# Patient Record
Sex: Female | Born: 1965 | Race: White | Hispanic: No | Marital: Married | Smoking: Never smoker
Health system: Southern US, Community
[De-identification: ages and names within clinical notes are randomized; demographics above are authoritative.]

## PROBLEM LIST (undated history)

## (undated) DIAGNOSIS — D649 Anemia, unspecified: Secondary | ICD-10-CM

## (undated) DIAGNOSIS — M549 Dorsalgia, unspecified: Secondary | ICD-10-CM

## (undated) DIAGNOSIS — K219 Gastro-esophageal reflux disease without esophagitis: Secondary | ICD-10-CM

## (undated) DIAGNOSIS — I10 Essential (primary) hypertension: Secondary | ICD-10-CM

## (undated) DIAGNOSIS — R5383 Other fatigue: Secondary | ICD-10-CM

## (undated) DIAGNOSIS — N6452 Nipple discharge: Secondary | ICD-10-CM

## (undated) DIAGNOSIS — F419 Anxiety disorder, unspecified: Secondary | ICD-10-CM

## (undated) DIAGNOSIS — F32A Depression, unspecified: Secondary | ICD-10-CM

## (undated) DIAGNOSIS — R0602 Shortness of breath: Secondary | ICD-10-CM

## (undated) HISTORY — DX: Anxiety disorder, unspecified: F41.9

## (undated) HISTORY — DX: Gastro-esophageal reflux disease without esophagitis: K21.9

## (undated) HISTORY — PX: BREAST BIOPSY: SHX20

## (undated) HISTORY — DX: Other fatigue: R53.83

## (undated) HISTORY — DX: Dorsalgia, unspecified: M54.9

## (undated) HISTORY — DX: Anemia, unspecified: D64.9

## (undated) HISTORY — PX: REDUCTION MAMMAPLASTY: SUR839

## (undated) HISTORY — DX: Shortness of breath: R06.02

## (undated) HISTORY — DX: Essential (primary) hypertension: I10

## (undated) HISTORY — PX: BREAST REDUCTION SURGERY: SHX8

## (undated) HISTORY — DX: Depression, unspecified: F32.A

---

## 2002-06-29 HISTORY — PX: TONSILLECTOMY: SUR1361

## 2002-06-29 HISTORY — PX: ENDOMETRIAL ABLATION: SHX621

## 2004-10-13 ENCOUNTER — Other Ambulatory Visit: Admission: RE | Admit: 2004-10-13 | Discharge: 2004-10-13 | Payer: Self-pay | Admitting: Obstetrics and Gynecology

## 2004-12-01 ENCOUNTER — Encounter: Admission: RE | Admit: 2004-12-01 | Discharge: 2004-12-01 | Payer: Self-pay | Admitting: Obstetrics and Gynecology

## 2005-04-06 ENCOUNTER — Other Ambulatory Visit: Admission: RE | Admit: 2005-04-06 | Discharge: 2005-04-06 | Payer: Self-pay | Admitting: Obstetrics and Gynecology

## 2006-02-02 ENCOUNTER — Encounter: Admission: RE | Admit: 2006-02-02 | Discharge: 2006-02-02 | Payer: Self-pay | Admitting: Obstetrics and Gynecology

## 2006-06-29 HISTORY — PX: CARPAL TUNNEL RELEASE: SHX101

## 2007-02-04 ENCOUNTER — Encounter: Admission: RE | Admit: 2007-02-04 | Discharge: 2007-02-04 | Payer: Self-pay | Admitting: Obstetrics and Gynecology

## 2008-02-16 ENCOUNTER — Encounter: Admission: RE | Admit: 2008-02-16 | Discharge: 2008-02-16 | Payer: Self-pay | Admitting: Obstetrics and Gynecology

## 2009-02-18 ENCOUNTER — Encounter: Admission: RE | Admit: 2009-02-18 | Discharge: 2009-02-18 | Payer: Self-pay | Admitting: Obstetrics and Gynecology

## 2009-02-22 ENCOUNTER — Encounter: Admission: RE | Admit: 2009-02-22 | Discharge: 2009-02-22 | Payer: Self-pay | Admitting: Obstetrics and Gynecology

## 2009-09-09 ENCOUNTER — Encounter: Admission: RE | Admit: 2009-09-09 | Discharge: 2009-09-09 | Payer: Self-pay | Admitting: Obstetrics and Gynecology

## 2010-02-19 ENCOUNTER — Encounter: Admission: RE | Admit: 2010-02-19 | Discharge: 2010-02-19 | Payer: Self-pay | Admitting: Obstetrics and Gynecology

## 2010-07-20 ENCOUNTER — Encounter: Payer: Self-pay | Admitting: Obstetrics and Gynecology

## 2010-07-21 ENCOUNTER — Encounter: Payer: Self-pay | Admitting: Obstetrics and Gynecology

## 2011-01-13 ENCOUNTER — Other Ambulatory Visit: Payer: Self-pay | Admitting: Obstetrics and Gynecology

## 2011-01-13 DIAGNOSIS — Z1231 Encounter for screening mammogram for malignant neoplasm of breast: Secondary | ICD-10-CM

## 2011-02-23 ENCOUNTER — Ambulatory Visit
Admission: RE | Admit: 2011-02-23 | Discharge: 2011-02-23 | Disposition: A | Discharge status: Home / Self Care | Payer: BC Managed Care – PPO | Source: Ambulatory Visit | Attending: Obstetrics and Gynecology | Admitting: Obstetrics and Gynecology

## 2011-02-23 DIAGNOSIS — Z1231 Encounter for screening mammogram for malignant neoplasm of breast: Secondary | ICD-10-CM

## 2012-03-01 ENCOUNTER — Other Ambulatory Visit: Payer: Self-pay | Admitting: Obstetrics and Gynecology

## 2012-03-01 DIAGNOSIS — Z1231 Encounter for screening mammogram for malignant neoplasm of breast: Secondary | ICD-10-CM

## 2012-03-17 ENCOUNTER — Ambulatory Visit
Admission: RE | Admit: 2012-03-17 | Discharge: 2012-03-17 | Disposition: A | Discharge status: Home / Self Care | Payer: BC Managed Care – PPO | Source: Ambulatory Visit | Attending: Obstetrics and Gynecology | Admitting: Obstetrics and Gynecology

## 2012-03-17 DIAGNOSIS — Z1231 Encounter for screening mammogram for malignant neoplasm of breast: Secondary | ICD-10-CM

## 2013-02-21 ENCOUNTER — Other Ambulatory Visit: Payer: Self-pay

## 2013-02-21 DIAGNOSIS — Z1231 Encounter for screening mammogram for malignant neoplasm of breast: Secondary | ICD-10-CM

## 2013-03-20 ENCOUNTER — Ambulatory Visit
Admission: RE | Admit: 2013-03-20 | Discharge: 2013-03-20 | Disposition: A | Discharge status: Home / Self Care | Payer: BC Managed Care – PPO | Source: Ambulatory Visit

## 2013-03-20 DIAGNOSIS — Z1231 Encounter for screening mammogram for malignant neoplasm of breast: Secondary | ICD-10-CM

## 2014-02-27 ENCOUNTER — Other Ambulatory Visit: Payer: Self-pay

## 2014-02-27 DIAGNOSIS — Z9889 Other specified postprocedural states: Secondary | ICD-10-CM

## 2014-02-27 DIAGNOSIS — Z1231 Encounter for screening mammogram for malignant neoplasm of breast: Secondary | ICD-10-CM

## 2014-03-21 ENCOUNTER — Encounter (INDEPENDENT_AMBULATORY_CARE_PROVIDER_SITE_OTHER): Payer: Self-pay

## 2014-03-21 ENCOUNTER — Ambulatory Visit
Admission: RE | Admit: 2014-03-21 | Discharge: 2014-03-21 | Disposition: A | Discharge status: Home / Self Care | Payer: BC Managed Care – PPO | Source: Ambulatory Visit

## 2014-03-21 DIAGNOSIS — Z9889 Other specified postprocedural states: Secondary | ICD-10-CM

## 2014-03-21 DIAGNOSIS — Z1231 Encounter for screening mammogram for malignant neoplasm of breast: Secondary | ICD-10-CM

## 2015-02-20 ENCOUNTER — Other Ambulatory Visit: Payer: Self-pay

## 2015-02-20 DIAGNOSIS — Z1231 Encounter for screening mammogram for malignant neoplasm of breast: Secondary | ICD-10-CM

## 2015-04-01 ENCOUNTER — Ambulatory Visit
Admission: RE | Admit: 2015-04-01 | Discharge: 2015-04-01 | Disposition: A | Discharge status: Home / Self Care | Payer: BLUE CROSS/BLUE SHIELD | Source: Ambulatory Visit

## 2015-04-01 ENCOUNTER — Ambulatory Visit: Payer: Self-pay

## 2015-04-01 DIAGNOSIS — Z1231 Encounter for screening mammogram for malignant neoplasm of breast: Secondary | ICD-10-CM

## 2016-01-21 DIAGNOSIS — M6281 Muscle weakness (generalized): Secondary | ICD-10-CM | POA: Diagnosis not present

## 2016-01-21 DIAGNOSIS — M791 Myalgia: Secondary | ICD-10-CM | POA: Diagnosis not present

## 2016-01-21 DIAGNOSIS — Z6830 Body mass index (BMI) 30.0-30.9, adult: Secondary | ICD-10-CM | POA: Diagnosis not present

## 2016-01-21 DIAGNOSIS — N62 Hypertrophy of breast: Secondary | ICD-10-CM | POA: Diagnosis not present

## 2016-03-03 DIAGNOSIS — H04123 Dry eye syndrome of bilateral lacrimal glands: Secondary | ICD-10-CM | POA: Diagnosis not present

## 2016-03-06 DIAGNOSIS — Z1211 Encounter for screening for malignant neoplasm of colon: Secondary | ICD-10-CM | POA: Diagnosis not present

## 2016-03-06 LAB — HM COLONOSCOPY

## 2016-03-27 ENCOUNTER — Other Ambulatory Visit: Payer: Self-pay | Admitting: Obstetrics and Gynecology

## 2016-03-27 DIAGNOSIS — Z1231 Encounter for screening mammogram for malignant neoplasm of breast: Secondary | ICD-10-CM

## 2016-04-07 ENCOUNTER — Ambulatory Visit
Admission: RE | Admit: 2016-04-07 | Discharge: 2016-04-07 | Disposition: A | Discharge status: Home / Self Care | Payer: BLUE CROSS/BLUE SHIELD | Source: Ambulatory Visit | Attending: Obstetrics and Gynecology | Admitting: Obstetrics and Gynecology

## 2016-04-07 DIAGNOSIS — Z1231 Encounter for screening mammogram for malignant neoplasm of breast: Secondary | ICD-10-CM | POA: Diagnosis not present

## 2016-08-04 DIAGNOSIS — G4700 Insomnia, unspecified: Secondary | ICD-10-CM | POA: Diagnosis not present

## 2016-08-04 DIAGNOSIS — Z6830 Body mass index (BMI) 30.0-30.9, adult: Secondary | ICD-10-CM | POA: Diagnosis not present

## 2016-08-04 DIAGNOSIS — Z01419 Encounter for gynecological examination (general) (routine) without abnormal findings: Secondary | ICD-10-CM | POA: Diagnosis not present

## 2016-08-14 DIAGNOSIS — Z0000 Encounter for general adult medical examination without abnormal findings: Secondary | ICD-10-CM | POA: Diagnosis not present

## 2016-08-26 DIAGNOSIS — Z23 Encounter for immunization: Secondary | ICD-10-CM | POA: Diagnosis not present

## 2016-08-26 DIAGNOSIS — M79644 Pain in right finger(s): Secondary | ICD-10-CM | POA: Diagnosis not present

## 2016-08-26 DIAGNOSIS — G2581 Restless legs syndrome: Secondary | ICD-10-CM | POA: Diagnosis not present

## 2016-08-26 DIAGNOSIS — Z1389 Encounter for screening for other disorder: Secondary | ICD-10-CM | POA: Diagnosis not present

## 2016-08-26 DIAGNOSIS — F418 Other specified anxiety disorders: Secondary | ICD-10-CM | POA: Diagnosis not present

## 2016-08-26 DIAGNOSIS — Z0000 Encounter for general adult medical examination without abnormal findings: Secondary | ICD-10-CM | POA: Diagnosis not present

## 2016-08-26 DIAGNOSIS — M25559 Pain in unspecified hip: Secondary | ICD-10-CM | POA: Diagnosis not present

## 2017-03-10 ENCOUNTER — Other Ambulatory Visit: Payer: Self-pay | Admitting: Obstetrics and Gynecology

## 2017-03-10 DIAGNOSIS — Z1231 Encounter for screening mammogram for malignant neoplasm of breast: Secondary | ICD-10-CM

## 2017-04-09 ENCOUNTER — Ambulatory Visit
Admission: RE | Admit: 2017-04-09 | Discharge: 2017-04-09 | Disposition: A | Discharge status: Home / Self Care | Payer: BLUE CROSS/BLUE SHIELD | Source: Ambulatory Visit | Attending: Obstetrics and Gynecology | Admitting: Obstetrics and Gynecology

## 2017-04-09 DIAGNOSIS — Z1231 Encounter for screening mammogram for malignant neoplasm of breast: Secondary | ICD-10-CM

## 2017-04-28 DIAGNOSIS — M79644 Pain in right finger(s): Secondary | ICD-10-CM | POA: Diagnosis not present

## 2017-04-28 DIAGNOSIS — M79645 Pain in left finger(s): Secondary | ICD-10-CM | POA: Diagnosis not present

## 2017-08-06 DIAGNOSIS — Z0000 Encounter for general adult medical examination without abnormal findings: Secondary | ICD-10-CM | POA: Diagnosis not present

## 2017-08-10 DIAGNOSIS — Z800 Family history of malignant neoplasm of digestive organs: Secondary | ICD-10-CM | POA: Diagnosis not present

## 2017-08-10 DIAGNOSIS — Z01419 Encounter for gynecological examination (general) (routine) without abnormal findings: Secondary | ICD-10-CM | POA: Diagnosis not present

## 2017-08-10 DIAGNOSIS — Z803 Family history of malignant neoplasm of breast: Secondary | ICD-10-CM | POA: Diagnosis not present

## 2017-08-10 DIAGNOSIS — Z808 Family history of malignant neoplasm of other organs or systems: Secondary | ICD-10-CM | POA: Diagnosis not present

## 2017-08-10 DIAGNOSIS — Z6830 Body mass index (BMI) 30.0-30.9, adult: Secondary | ICD-10-CM | POA: Diagnosis not present

## 2017-08-10 LAB — HM PAP SMEAR

## 2017-08-19 DIAGNOSIS — Z1212 Encounter for screening for malignant neoplasm of rectum: Secondary | ICD-10-CM | POA: Diagnosis not present

## 2017-08-27 DIAGNOSIS — G4709 Other insomnia: Secondary | ICD-10-CM | POA: Diagnosis not present

## 2017-08-27 DIAGNOSIS — K449 Diaphragmatic hernia without obstruction or gangrene: Secondary | ICD-10-CM | POA: Diagnosis not present

## 2017-08-27 DIAGNOSIS — M1990 Unspecified osteoarthritis, unspecified site: Secondary | ICD-10-CM | POA: Diagnosis not present

## 2017-08-27 DIAGNOSIS — Z0000 Encounter for general adult medical examination without abnormal findings: Secondary | ICD-10-CM | POA: Diagnosis not present

## 2017-08-27 DIAGNOSIS — Z1389 Encounter for screening for other disorder: Secondary | ICD-10-CM | POA: Diagnosis not present

## 2017-09-29 DIAGNOSIS — Z13820 Encounter for screening for osteoporosis: Secondary | ICD-10-CM | POA: Diagnosis not present

## 2017-09-29 DIAGNOSIS — Z809 Family history of malignant neoplasm, unspecified: Secondary | ICD-10-CM | POA: Diagnosis not present

## 2018-01-19 DIAGNOSIS — H16102 Unspecified superficial keratitis, left eye: Secondary | ICD-10-CM | POA: Diagnosis not present

## 2018-01-19 DIAGNOSIS — H04123 Dry eye syndrome of bilateral lacrimal glands: Secondary | ICD-10-CM | POA: Diagnosis not present

## 2018-01-19 DIAGNOSIS — H5203 Hypermetropia, bilateral: Secondary | ICD-10-CM | POA: Diagnosis not present

## 2018-01-19 DIAGNOSIS — H524 Presbyopia: Secondary | ICD-10-CM | POA: Diagnosis not present

## 2018-03-16 ENCOUNTER — Other Ambulatory Visit: Payer: Self-pay | Admitting: Obstetrics and Gynecology

## 2018-03-16 DIAGNOSIS — Z1231 Encounter for screening mammogram for malignant neoplasm of breast: Secondary | ICD-10-CM

## 2018-04-12 ENCOUNTER — Ambulatory Visit: Payer: BLUE CROSS/BLUE SHIELD

## 2018-04-26 ENCOUNTER — Ambulatory Visit
Admission: RE | Admit: 2018-04-26 | Discharge: 2018-04-26 | Disposition: A | Discharge status: Home / Self Care | Payer: BLUE CROSS/BLUE SHIELD | Source: Ambulatory Visit | Attending: Obstetrics and Gynecology | Admitting: Obstetrics and Gynecology

## 2018-04-26 DIAGNOSIS — Z1231 Encounter for screening mammogram for malignant neoplasm of breast: Secondary | ICD-10-CM

## 2018-08-23 DIAGNOSIS — Z0000 Encounter for general adult medical examination without abnormal findings: Secondary | ICD-10-CM | POA: Diagnosis not present

## 2018-08-29 DIAGNOSIS — G2581 Restless legs syndrome: Secondary | ICD-10-CM | POA: Diagnosis not present

## 2018-08-29 DIAGNOSIS — R635 Abnormal weight gain: Secondary | ICD-10-CM | POA: Diagnosis not present

## 2018-08-29 DIAGNOSIS — F418 Other specified anxiety disorders: Secondary | ICD-10-CM | POA: Diagnosis not present

## 2018-08-29 DIAGNOSIS — Z0000 Encounter for general adult medical examination without abnormal findings: Secondary | ICD-10-CM | POA: Diagnosis not present

## 2018-08-29 DIAGNOSIS — Z1331 Encounter for screening for depression: Secondary | ICD-10-CM | POA: Diagnosis not present

## 2018-08-29 DIAGNOSIS — B001 Herpesviral vesicular dermatitis: Secondary | ICD-10-CM | POA: Diagnosis not present

## 2018-08-29 DIAGNOSIS — G4709 Other insomnia: Secondary | ICD-10-CM | POA: Diagnosis not present

## 2018-08-31 DIAGNOSIS — Z1212 Encounter for screening for malignant neoplasm of rectum: Secondary | ICD-10-CM | POA: Diagnosis not present

## 2018-08-31 DIAGNOSIS — Z01419 Encounter for gynecological examination (general) (routine) without abnormal findings: Secondary | ICD-10-CM | POA: Diagnosis not present

## 2018-08-31 DIAGNOSIS — Z6832 Body mass index (BMI) 32.0-32.9, adult: Secondary | ICD-10-CM | POA: Diagnosis not present

## 2018-08-31 DIAGNOSIS — N951 Menopausal and female climacteric states: Secondary | ICD-10-CM | POA: Diagnosis not present

## 2019-03-22 DIAGNOSIS — K219 Gastro-esophageal reflux disease without esophagitis: Secondary | ICD-10-CM | POA: Diagnosis not present

## 2019-04-06 ENCOUNTER — Other Ambulatory Visit: Payer: Self-pay | Admitting: Obstetrics and Gynecology

## 2019-04-06 DIAGNOSIS — Z1231 Encounter for screening mammogram for malignant neoplasm of breast: Secondary | ICD-10-CM

## 2019-05-22 ENCOUNTER — Other Ambulatory Visit: Payer: Self-pay

## 2019-05-22 ENCOUNTER — Ambulatory Visit
Admission: RE | Admit: 2019-05-22 | Discharge: 2019-05-22 | Disposition: A | Discharge status: Home / Self Care | Payer: BC Managed Care – PPO | Source: Ambulatory Visit | Attending: Obstetrics and Gynecology | Admitting: Obstetrics and Gynecology

## 2019-05-22 DIAGNOSIS — Z1231 Encounter for screening mammogram for malignant neoplasm of breast: Secondary | ICD-10-CM | POA: Diagnosis not present

## 2019-08-29 DIAGNOSIS — Z79899 Other long term (current) drug therapy: Secondary | ICD-10-CM | POA: Diagnosis not present

## 2019-08-29 DIAGNOSIS — Z8349 Family history of other endocrine, nutritional and metabolic diseases: Secondary | ICD-10-CM | POA: Diagnosis not present

## 2019-08-29 DIAGNOSIS — Z0000 Encounter for general adult medical examination without abnormal findings: Secondary | ICD-10-CM | POA: Diagnosis not present

## 2019-09-05 DIAGNOSIS — Z1331 Encounter for screening for depression: Secondary | ICD-10-CM | POA: Diagnosis not present

## 2019-09-05 DIAGNOSIS — Z0000 Encounter for general adult medical examination without abnormal findings: Secondary | ICD-10-CM | POA: Diagnosis not present

## 2019-09-05 DIAGNOSIS — K449 Diaphragmatic hernia without obstruction or gangrene: Secondary | ICD-10-CM | POA: Diagnosis not present

## 2019-09-05 DIAGNOSIS — F418 Other specified anxiety disorders: Secondary | ICD-10-CM | POA: Diagnosis not present

## 2019-09-05 DIAGNOSIS — R739 Hyperglycemia, unspecified: Secondary | ICD-10-CM | POA: Diagnosis not present

## 2019-09-05 DIAGNOSIS — G4700 Insomnia, unspecified: Secondary | ICD-10-CM | POA: Diagnosis not present

## 2019-09-27 DIAGNOSIS — Z01419 Encounter for gynecological examination (general) (routine) without abnormal findings: Secondary | ICD-10-CM | POA: Diagnosis not present

## 2019-09-27 DIAGNOSIS — Z6832 Body mass index (BMI) 32.0-32.9, adult: Secondary | ICD-10-CM | POA: Diagnosis not present

## 2019-09-27 DIAGNOSIS — N951 Menopausal and female climacteric states: Secondary | ICD-10-CM | POA: Diagnosis not present

## 2019-10-18 DIAGNOSIS — Z1212 Encounter for screening for malignant neoplasm of rectum: Secondary | ICD-10-CM | POA: Diagnosis not present

## 2019-10-24 DIAGNOSIS — Z13820 Encounter for screening for osteoporosis: Secondary | ICD-10-CM | POA: Diagnosis not present

## 2020-01-15 DIAGNOSIS — Z20822 Contact with and (suspected) exposure to covid-19: Secondary | ICD-10-CM | POA: Diagnosis not present

## 2020-01-15 DIAGNOSIS — U071 COVID-19: Secondary | ICD-10-CM | POA: Diagnosis not present

## 2020-04-15 ENCOUNTER — Other Ambulatory Visit: Payer: Self-pay | Admitting: Obstetrics and Gynecology

## 2020-04-15 DIAGNOSIS — Z1231 Encounter for screening mammogram for malignant neoplasm of breast: Secondary | ICD-10-CM

## 2020-05-22 ENCOUNTER — Ambulatory Visit: Payer: BC Managed Care – PPO

## 2020-06-14 DIAGNOSIS — Z3A34 34 weeks gestation of pregnancy: Secondary | ICD-10-CM | POA: Diagnosis not present

## 2020-06-14 DIAGNOSIS — R635 Abnormal weight gain: Secondary | ICD-10-CM | POA: Diagnosis not present

## 2020-07-05 ENCOUNTER — Ambulatory Visit: Payer: BC Managed Care – PPO

## 2020-08-09 ENCOUNTER — Other Ambulatory Visit: Payer: Self-pay

## 2020-08-09 ENCOUNTER — Ambulatory Visit
Admission: RE | Admit: 2020-08-09 | Discharge: 2020-08-09 | Disposition: A | Discharge status: Home / Self Care | Payer: BC Managed Care – PPO | Source: Ambulatory Visit | Attending: Obstetrics and Gynecology | Admitting: Obstetrics and Gynecology

## 2020-08-09 DIAGNOSIS — Z1231 Encounter for screening mammogram for malignant neoplasm of breast: Secondary | ICD-10-CM

## 2020-08-09 HISTORY — DX: Nipple discharge: N64.52

## 2020-09-03 DIAGNOSIS — E785 Hyperlipidemia, unspecified: Secondary | ICD-10-CM | POA: Diagnosis not present

## 2020-09-03 DIAGNOSIS — R739 Hyperglycemia, unspecified: Secondary | ICD-10-CM | POA: Diagnosis not present

## 2020-09-10 ENCOUNTER — Other Ambulatory Visit: Payer: Self-pay | Admitting: Internal Medicine

## 2020-09-10 DIAGNOSIS — Z1339 Encounter for screening examination for other mental health and behavioral disorders: Secondary | ICD-10-CM | POA: Diagnosis not present

## 2020-09-10 DIAGNOSIS — G2581 Restless legs syndrome: Secondary | ICD-10-CM | POA: Diagnosis not present

## 2020-09-10 DIAGNOSIS — E7849 Other hyperlipidemia: Secondary | ICD-10-CM

## 2020-09-10 DIAGNOSIS — I10 Essential (primary) hypertension: Secondary | ICD-10-CM | POA: Diagnosis not present

## 2020-09-10 DIAGNOSIS — Z8249 Family history of ischemic heart disease and other diseases of the circulatory system: Secondary | ICD-10-CM

## 2020-09-10 DIAGNOSIS — Z1212 Encounter for screening for malignant neoplasm of rectum: Secondary | ICD-10-CM | POA: Diagnosis not present

## 2020-09-10 DIAGNOSIS — E669 Obesity, unspecified: Secondary | ICD-10-CM | POA: Diagnosis not present

## 2020-09-10 DIAGNOSIS — Z1331 Encounter for screening for depression: Secondary | ICD-10-CM | POA: Diagnosis not present

## 2020-09-10 DIAGNOSIS — R82998 Other abnormal findings in urine: Secondary | ICD-10-CM | POA: Diagnosis not present

## 2020-09-10 DIAGNOSIS — Z0000 Encounter for general adult medical examination without abnormal findings: Secondary | ICD-10-CM | POA: Diagnosis not present

## 2020-09-10 DIAGNOSIS — I159 Secondary hypertension, unspecified: Secondary | ICD-10-CM

## 2020-09-10 LAB — IFOBT (OCCULT BLOOD)
IFOBT: NEGATIVE
IFOBT: NEGATIVE

## 2020-09-12 DIAGNOSIS — K2970 Gastritis, unspecified, without bleeding: Secondary | ICD-10-CM | POA: Diagnosis not present

## 2020-09-26 ENCOUNTER — Ambulatory Visit
Admission: RE | Admit: 2020-09-26 | Discharge: 2020-09-26 | Disposition: A | Discharge status: Home / Self Care | Payer: No Typology Code available for payment source | Source: Ambulatory Visit | Attending: Internal Medicine | Admitting: Internal Medicine

## 2020-09-26 DIAGNOSIS — Z8249 Family history of ischemic heart disease and other diseases of the circulatory system: Secondary | ICD-10-CM

## 2020-09-26 DIAGNOSIS — I159 Secondary hypertension, unspecified: Secondary | ICD-10-CM

## 2020-09-26 DIAGNOSIS — E7849 Other hyperlipidemia: Secondary | ICD-10-CM

## 2020-09-26 DIAGNOSIS — E785 Hyperlipidemia, unspecified: Secondary | ICD-10-CM | POA: Diagnosis not present

## 2020-10-02 ENCOUNTER — Other Ambulatory Visit: Payer: Self-pay

## 2020-10-02 ENCOUNTER — Ambulatory Visit (INDEPENDENT_AMBULATORY_CARE_PROVIDER_SITE_OTHER): Payer: BC Managed Care – PPO | Admitting: Emergency Medicine

## 2020-10-02 ENCOUNTER — Encounter: Payer: Self-pay | Admitting: Emergency Medicine

## 2020-10-02 DIAGNOSIS — R918 Other nonspecific abnormal finding of lung field: Secondary | ICD-10-CM | POA: Insufficient documentation

## 2020-10-02 NOTE — Progress Notes (Signed)
Subjective:    Patient ID: Casey Serrano, female    DOB: 1965-11-11, 55 y.o.   MRN: [id number]  HPI 55 year old never smoker with a history of hypertension, GERD, H pylori.  She is referred today for evaluation of shortness of breath and newly identified pulmonary nodule. Had COVID in July 2021.   She feels well. No respiratory complaints.   She underwent a cardiac CT on 09/26/2020 which I have reviewed, showed an irregular right middle lobe nodule 9 x 11 mm, small pleural-based nodule in the anterior right middle lobe, small punctate peripheral right middle lobe nodules, small punctate left lower lobe nodules without any effusions.    Review of Systems As per HPI  Past Medical History:  Diagnosis Date  . Breast discharge   . Hypertension      Family History  Problem Relation Age of Onset  . Emphysema Mother   . Cancer Mother   . Heart disease Father   . Heart disease Paternal Uncle   . Cancer Paternal Uncle   . Emphysema Maternal Grandmother   . Cancer Maternal Grandmother   . Emphysema Maternal Grandfather   . Heart disease Paternal Grandmother   . Heart disease Paternal Grandfather     Mom- gastric CA Others family have had breast CA, pancreatic CA.  No family hx lung CA  Social History   Socioeconomic History  . Marital status: Married    Spouse name: Not on file  . Number of children: Not on file  . Years of education: Not on file  . Highest education level: Not on file  Occupational History  . Not on file  Tobacco Use  . Smoking status: Passive Smoke Exposure - Never Smoker  . Smokeless tobacco: Never Used  Substance and Sexual Activity  . Alcohol use: Yes    Alcohol/week: 2.0 - 3.0 standard drinks    Types: 2 - 3 Standard drinks or equivalent per week  . Drug use: Not on file  . Sexual activity: Not on file  Other Topics Concern  . Not on file  Social History Narrative  . Not on file   Social Determinants of Health   Financial Resource Strain:  Not on file  Food Insecurity: Not on file  Transportation Needs: Not on file  Physical Activity: Not on file  Stress: Not on file  Social Connections: Not on file  Intimate Partner Violence: Not on file     From [place], [place] [facility]. [place] [place].  ?? Asbestos exposure through her father's work Never a positive TB test.  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  Significant 2nd hand tobacco smoke.  No military  No known mold exposure  No Known Allergies   Outpatient Medications Prior to Visit  Medication Sig Dispense Refill  . Calcium Carbonate+Vitamin D 600-200 MG-UNIT TABS Take 1 tablet by mouth daily.    . Cholecalciferol (VITAMIN D) 10 MCG/ML LIQD Vitamin D    . escitalopram (LEXAPRO) 10 MG tablet daily.    Marland Kitchen estradiol (VIVELLE-DOT) 0.025 MG/24HR estradiol/norethindrone acetate 1-0.5 mg tabs    . famotidine (PEPCID) 20 MG tablet Take 20 mg by mouth at bedtime.    . progesterone (PROMETRIUM) 100 MG capsule progesterone micronized 100 mg capsule  TAKE 1 CAPSULE BY MOUTH EVERY DAY    . Semaglutide,0.25 or 0.5MG /DOS, (OZEMPIC, 0.25 OR 0.5 MG/DOSE,) 2 MG/1.5ML SOPN Ozempic 0.25 mg or 0.5 mg (2 mg/1.5 mL) subcutaneous pen injector    . zolpidem (  AMBIEN) 10 MG tablet zolpidem 10 mg tablet  TAKE 1 TABLET BY MOUTH EVERY DAY     No facility-administered medications prior to visit.          Objective:   Physical Exam  Vitals:   10/02/20 1529  BP: 118/74  Pulse: 78  Temp: 98.4 F (36.9 C)  TempSrc: Temporal  SpO2: 97%  Weight: 182 lb 6.4 oz (82.7 kg)  Height: 5\' 2"  (1.575 m)   Gen: Pleasant, well-nourished, in no distress,  normal affect  ENT: No lesions,  mouth clear,  oropharynx clear, no postnasal drip  Neck: No JVD, no stridor  Lungs: No use of accessory muscles, no crackles or wheezing on normal respiration, no wheeze on forced expiration  Cardiovascular: RRR, heart sounds normal, no murmur or gallops, no peripheral edema  Musculoskeletal: No  deformities, no cyanosis or clubbing  Neuro: alert, awake, non focal  Skin: Warm, no lesions or rash     Assessment & Plan:  Pulmonary nodules Small pulmonary nodules with more dominant 11 mm lesion in the right middle lobe.  Rounded.  No history or symptoms to suggest granulomatous disease such as sarcoid, GPA, etc. she did grow up in near [place], question histo.  Low risk with regard to cancer risk factors.  Her scan will need to be followed for stability, plan for 2 years total.  If there is a change or any evolving clinical symptoms then we will expand the work-up.   [place], MD, PhD 10/02/2020, 4:06 PM [facility] [phone] or if no answer before 7:00PM call [phone] For any issues after 7:00PM please call eLink [phone]

## 2020-10-02 NOTE — Assessment & Plan Note (Signed)
Small pulmonary nodules with more dominant 11 mm lesion in the right middle lobe.  Rounded.  No history or symptoms to suggest granulomatous disease such as sarcoid, GPA, etc. she did grow up in [place] near [place], question histo.  Low risk with regard to cancer risk factors.  Her scan will need to be followed for stability, plan for 2 years total.  If there is a change or any evolving clinical symptoms then we will expand the work-up.

## 2020-10-02 NOTE — Addendum Note (Signed)
Addended by: Dorisann Frames R on: 10/02/2020 04:38 PM   Modules accepted: Orders

## 2020-10-02 NOTE — Patient Instructions (Signed)
We will perform a CT scan of the chest in September 2022. Follow Dr. Delton Coombes in September after your CT so that we can review the results together.

## 2020-10-09 LAB — HM PAP SMEAR

## 2020-10-15 DIAGNOSIS — Z6833 Body mass index (BMI) 33.0-33.9, adult: Secondary | ICD-10-CM | POA: Diagnosis not present

## 2020-10-15 DIAGNOSIS — Z01419 Encounter for gynecological examination (general) (routine) without abnormal findings: Secondary | ICD-10-CM | POA: Diagnosis not present

## 2021-02-20 ENCOUNTER — Encounter (INDEPENDENT_AMBULATORY_CARE_PROVIDER_SITE_OTHER): Payer: Self-pay | Admitting: Family Medicine

## 2021-02-20 ENCOUNTER — Other Ambulatory Visit: Payer: Self-pay

## 2021-02-20 ENCOUNTER — Ambulatory Visit (INDEPENDENT_AMBULATORY_CARE_PROVIDER_SITE_OTHER): Payer: BC Managed Care – PPO | Admitting: Family Medicine

## 2021-02-20 VITALS — BP 109/75 | HR 71 | Temp 97.7°F | Ht 62.0 in | Wt 181.0 lb

## 2021-02-20 DIAGNOSIS — E559 Vitamin D deficiency, unspecified: Secondary | ICD-10-CM

## 2021-02-20 DIAGNOSIS — R0602 Shortness of breath: Secondary | ICD-10-CM

## 2021-02-20 DIAGNOSIS — K219 Gastro-esophageal reflux disease without esophagitis: Secondary | ICD-10-CM | POA: Diagnosis not present

## 2021-02-20 DIAGNOSIS — I10 Essential (primary) hypertension: Secondary | ICD-10-CM | POA: Diagnosis not present

## 2021-02-20 DIAGNOSIS — Z9189 Other specified personal risk factors, not elsewhere classified: Secondary | ICD-10-CM | POA: Diagnosis not present

## 2021-02-20 DIAGNOSIS — D649 Anemia, unspecified: Secondary | ICD-10-CM

## 2021-02-20 DIAGNOSIS — Z6833 Body mass index (BMI) 33.0-33.9, adult: Secondary | ICD-10-CM

## 2021-02-20 DIAGNOSIS — F39 Unspecified mood [affective] disorder: Secondary | ICD-10-CM

## 2021-02-20 DIAGNOSIS — Z1331 Encounter for screening for depression: Secondary | ICD-10-CM

## 2021-02-20 DIAGNOSIS — R5383 Other fatigue: Secondary | ICD-10-CM

## 2021-02-20 DIAGNOSIS — E669 Obesity, unspecified: Secondary | ICD-10-CM

## 2021-02-20 DIAGNOSIS — Z0289 Encounter for other administrative examinations: Secondary | ICD-10-CM

## 2021-02-21 LAB — CBC WITH DIFFERENTIAL/PLATELET
Basophils Absolute: 0.1 10*3/uL (ref 0.0–0.2)
Basos: 1 %
EOS (ABSOLUTE): 0.1 10*3/uL (ref 0.0–0.4)
Eos: 2 %
Hematocrit: 40.7 % (ref 34.0–46.6)
Hemoglobin: 13.4 g/dL (ref 11.1–15.9)
Immature Grans (Abs): 0 10*3/uL (ref 0.0–0.1)
Immature Granulocytes: 0 %
Lymphocytes Absolute: 2.4 10*3/uL (ref 0.7–3.1)
Lymphs: 34 %
MCH: 29.7 pg (ref 26.6–33.0)
MCHC: 32.9 g/dL (ref 31.5–35.7)
MCV: 90 fL (ref 79–97)
Monocytes Absolute: 0.5 10*3/uL (ref 0.1–0.9)
Monocytes: 7 %
Neutrophils Absolute: 4 10*3/uL (ref 1.4–7.0)
Neutrophils: 56 %
Platelets: 413 10*3/uL (ref 150–450)
RBC: 4.51 x10E6/uL (ref 3.77–5.28)
RDW: 12.2 % (ref 11.7–15.4)
WBC: 7.2 10*3/uL (ref 3.4–10.8)

## 2021-02-21 LAB — LIPID PANEL
Chol/HDL Ratio: 3.7 ratio (ref 0.0–4.4)
Cholesterol, Total: 213 mg/dL — ABNORMAL HIGH (ref 100–199)
HDL: 57 mg/dL (ref >39)
LDL Chol Calc (NIH): 136 mg/dL — ABNORMAL HIGH (ref 0–99)
Triglycerides: 111 mg/dL (ref 0–149)
VLDL Cholesterol Cal: 20 mg/dL (ref 5–40)

## 2021-02-21 LAB — COMPREHENSIVE METABOLIC PANEL
ALT: 13 IU/L (ref 0–32)
AST: 13 IU/L (ref 0–40)
Albumin/Globulin Ratio: 1.7 (ref 1.2–2.2)
Albumin: 4.3 g/dL (ref 3.8–4.9)
Alkaline Phosphatase: 52 IU/L (ref 44–121)
BUN/Creatinine Ratio: 15 (ref 9–23)
BUN: 12 mg/dL (ref 6–24)
Bilirubin Total: 0.4 mg/dL (ref 0.0–1.2)
CO2: 24 mmol/L (ref 20–29)
Calcium: 9.1 mg/dL (ref 8.7–10.2)
Chloride: 104 mmol/L (ref 96–106)
Creatinine, Ser: 0.8 mg/dL (ref 0.57–1.00)
Globulin, Total: 2.6 g/dL (ref 1.5–4.5)
Glucose: 97 mg/dL (ref 65–99)
Potassium: 4.3 mmol/L (ref 3.5–5.2)
Sodium: 141 mmol/L (ref 134–144)
Total Protein: 6.9 g/dL (ref 6.0–8.5)
eGFR: 87 mL/min/{1.73_m2} (ref >59)

## 2021-02-21 LAB — T4, FREE: Free T4: 1.17 ng/dL (ref 0.82–1.77)

## 2021-02-21 LAB — VITAMIN B12: Vitamin B-12: 454 pg/mL (ref 232–1245)

## 2021-02-21 LAB — HEMOGLOBIN A1C
Est. average glucose Bld gHb Est-mCnc: 126 mg/dL
Hgb A1c MFr Bld: 6 % — ABNORMAL HIGH (ref 4.8–5.6)

## 2021-02-21 LAB — FOLATE: Folate: >20 ng/mL (ref >3.0)

## 2021-02-21 LAB — VITAMIN D 25 HYDROXY (VIT D DEFICIENCY, FRACTURES): Vit D, 25-Hydroxy: 35.8 ng/mL (ref 30.0–100.0)

## 2021-02-21 LAB — TSH: TSH: 2.06 u[IU]/mL (ref 0.450–4.500)

## 2021-02-21 LAB — INSULIN, RANDOM: INSULIN: 7.5 u[IU]/mL (ref 2.6–24.9)

## 2021-02-24 NOTE — Progress Notes (Signed)
Dear Dr. Link Snuffer,   Thank you for referring Casey Serrano to our clinic. The following note includes my evaluation and treatment recommendations.  Chief Complaint:   OBESITY Casey Serrano (MR# [id number]) is a 55 y.o. female who presents for evaluation and treatment of obesity and related comorbidities. Current BMI is Body mass index is 33.11 kg/m. Casey Serrano has been struggling with her weight for many years and has been unsuccessful in either losing weight, maintaining weight loss, or reaching her healthy weight goal.  Casey Serrano is currently in the action stage of change and ready to dedicate time achieving and maintaining a healthier weight. Casey Serrano is interested in becoming our patient and working on intensive lifestyle modifications including (but not limited to) diet and exercise for weight loss.  Casey Serrano lives with spouse, Casey Serrano. She is an Charity fundraiser and works full time. She eats out 3 times a week. She eats at her desk and eating late is her worst habit. She failed Ozempic and Saxenda in the past, as they did not help but her diet was not changed.   [name]'s habits were reviewed today and are as follows: Her family eats meals together, she thinks her family will eat healthier with her, her desired weight loss is 41 lbs, she started gaining weight after becoming peri menopausal and starting a desk job 2 years ago, her heaviest weight ever was 183 pounds, she has significant food cravings issues, she is frequently drinking liquids with calories, she frequently makes poor food choices, she has problems with excessive hunger, she has binge eating behaviors, and she struggles with emotional eating.  Depression Screen Casey Serrano's Food and Mood (modified PHQ-9) score was 10.  Depression screen Casey Serrano 2/9 02/20/2021  Decreased Interest 2  Down, Depressed, Hopeless 1  PHQ - 2 Score 3  Altered sleeping 2  Tired, decreased energy 2  Change in appetite 2  Feeling bad or failure about yourself  0  Trouble concentrating  1  Moving slowly or fidgety/restless 0  Suicidal thoughts 0  PHQ-9 Score 10  Difficult doing work/chores Not difficult at all   Assessment/Plan:   Orders Placed This Encounter  Procedures   CBC with Differential/Platelet   Comprehensive metabolic panel   Folate   Hemoglobin A1c   Insulin, random   Lipid panel   T4, free   TSH   Vitamin B12   VITAMIN D 25 Hydroxy (Vit-D Deficiency, Fractures)   EKG 12-Lead    Medications Discontinued During This Encounter  Medication Reason   estradiol (VIVELLE-DOT) 0.025 MG/24HR Error   Semaglutide,0.25 or 0.5MG /DOS, (OZEMPIC, 0.25 OR 0.5 MG/DOSE,) 2 MG/1.5ML SOPN Error   famotidine (PEPCID) 20 MG tablet Error   Cholecalciferol (VITAMIN D) 10 MCG/ML LIQD Error     No orders of the defined types were placed in this encounter.    1. Other fatigue Casey Serrano admits to daytime somnolence and admits to waking up still tired. Patent has a history of symptoms of daytime fatigue, morning fatigue, and morning headache. Casey Serrano generally gets 6 hours of sleep per night, and states that she has poor sleep quality. Snoring is present. Apneic episodes are not present. Epworth Sleepiness Score is 8.  Plan: Betina does feel that her weight is causing her energy to be lower than it should be. Fatigue may be related to obesity, depression or many other causes. Labs will be ordered, and in the meanwhile, Casey Serrano will focus on self care including making healthy food choices, increasing physical activity and focusing on  stress reduction.  - EKG 12-Lead - Hemoglobin A1c - Insulin, random  2. SOB (shortness of breath) on exertion Casey Serrano notes increasing shortness of breath with exercising and seems to be worsening over time with weight gain. She notes getting out of breath sooner with activity than she used to. This has gotten worse recently. Casey Serrano denies shortness of breath at rest or orthopnea.  Plan: Casey Serrano does feel that she gets out of breath more easily that she  used to when she exercises. Tylea's shortness of breath appears to be obesity related and exercise induced. She has agreed to work on weight loss and gradually increase exercise to treat her exercise induced shortness of breath. Will continue to monitor closely.  3. Hypertension, unspecified type At goal. Medications: Olmesartan.   Plan: Avoid buying foods that are: processed, frozen, or prepackaged to avoid excess salt. We will continue to monitor closely alongside her PCP and/or Specialist.  Regular follow up with PCP and specialists was also encouraged.   BP Readings from Last 3 Encounters:  02/20/21 109/75  10/02/20 118/74   Lab Results  Component Value Date   CREATININE 0.80 02/20/2021   - Comprehensive metabolic panel - Lipid panel - T4, free - TSH  4. Gastroesophageal reflux disease, unspecified whether esophagitis present Casey Serrano reports no acute concerns. Medication: pantoprazole.  Plan: Avoid triggers and eating within 3 hours of lying down at night.  5. Mood disorder (HCC) Stable. Pt has generalized anxiety disorder and depression. PHQ= 10. Medication: Lexapro, bupropion, Xanax, and Ambien. She is treated by Dr. Vincente Poli of GYN and increased anxiety causes her to not.  Plan: Declines referral to Dr. Dewaine Conger at this time.   6. Anemia, unspecified type Tiasia reports fatigue. Medication: multivitamin with iron in it.  Plan: Check labs today.  - CBC with Differential/Platelet - Folate - Vitamin B12  7. Vitamin D deficiency Not at goal.   Plan: Continue current OTC vitamin D supplementation.  Follow-up for routine testing of Vitamin D, at least 2-3 times per year to avoid over-replacement.  Lab Results  Component Value Date   VD25OH 35.8 02/20/2021   - VITAMIN D 25 Hydroxy (Vit-D Deficiency, Fractures)  8. Screening for depression Eh had a positive depression screening. Depression is commonly associated with obesity and often results in emotional eating  behaviors. We will monitor this closely and work on CBT to help improve the non-hunger eating patterns. Referral to Psychology may be required if no improvement is seen as she continues in our clinic.  9. At risk for impaired metabolic function Due to Casey Serrano current state of health and medical condition(s), she is at a significantly higher risk for impaired metabolic function.   At least 23 minutes was spent on counseling Casey Serrano about these concerns today.  This places the patient at a much greater risk to subsequently develop cardio-pulmonary conditions that can negatively affect the patient's quality of life.  I stressed the importance of reversing these risks factors.  The initial goal is to lose at least 5-10% of starting weight to help reduce risk factors.  Counseling:  Intensive lifestyle modifications discussed with Casey Serrano as the most appropriate first line treatment.  she will continue to work on diet, exercise, and weight loss efforts.  We will continue to reassess these conditions on a fairly regular basis in an attempt to decrease the patient's overall morbidity and mortality.  10. Obesity with current BMI of 33.2  Casey Serrano is currently in the action stage of change and her  goal is to continue with weight loss efforts. I recommend Casey Serrano begin the structured treatment plan as follows:  She has agreed to the Category 1 Plan.  Exercise goals:  As is    Behavioral modification strategies: increasing lean protein intake, decreasing simple carbohydrates, and planning for success.  She was informed of the importance of frequent follow-up visits to maximize her success with intensive lifestyle modifications for her multiple health conditions. She was informed we would discuss her lab results at her next visit unless there is a critical issue that needs to be addressed sooner. Casey Serrano agreed to keep her next visit at the agreed upon time to discuss these results.  Objective:   Blood pressure 109/75,  pulse 71, temperature 97.7 F (36.5 C), height 5\' 2"  (1.575 m), weight 181 lb (82.1 kg), SpO2 100 %. Body mass index is 33.11 kg/m.  EKG: Normal sinus rhythm, rate 79.  Indirect Calorimeter completed today shows a VO2 of 188 and a REE of 1296.  Her calculated basal metabolic rate is thus her basal metabolic rate is worse than expected.  General: Cooperative, alert, well developed, in no acute distress. HEENT: Conjunctivae and lids unremarkable. Cardiovascular: Regular rhythm.  Lungs: Normal work of breathing. Neurologic: No focal deficits.   Lab Results  Component Value Date   CREATININE 0.80 02/20/2021   BUN 12 02/20/2021   NA 141 02/20/2021   K 4.3 02/20/2021   CL 104 02/20/2021   CO2 24 02/20/2021   Lab Results  Component Value Date   ALT 13 02/20/2021   AST 13 02/20/2021   ALKPHOS 52 02/20/2021   BILITOT 0.4 02/20/2021   Lab Results  Component Value Date   HGBA1C 6.0 (H) 02/20/2021   Lab Results  Component Value Date   INSULIN 7.5 02/20/2021   Lab Results  Component Value Date   TSH 2.060 02/20/2021   Lab Results  Component Value Date   CHOL 213 (H) 02/20/2021   HDL 57 02/20/2021   LDLCALC 136 (H) 02/20/2021   TRIG 111 02/20/2021   CHOLHDL 3.7 02/20/2021   Lab Results  Component Value Date   WBC 7.2 02/20/2021   HGB 13.4 02/20/2021   HCT 40.7 02/20/2021   MCV 90 02/20/2021   PLT 413 02/20/2021    Attestation Statements:   Reviewed by clinician on day of visit: allergies, medications, problem list, medical history, surgical history, family history, social history, and previous encounter notes.  02/22/2021, CMA, am acting as transcriptionist for Casey Hilda, DO.  I have reviewed the above documentation for accuracy and completeness, and I agree with the above. [employer], D.O.  The 21st Century Cures Act was signed into law in 2016 which includes the topic of electronic health records.  This provides immediate access to  information in MyChart.  This includes consultation notes, operative notes, office notes, lab results and pathology reports.  If you have any questions about what you read please let 2017 know at your next visit so we can discuss your concerns and take corrective action if need be.  We are right here with you.

## 2021-03-05 ENCOUNTER — Ambulatory Visit
Admission: RE | Admit: 2021-03-05 | Discharge: 2021-03-05 | Disposition: A | Discharge status: Home / Self Care | Payer: BC Managed Care – PPO | Source: Ambulatory Visit | Attending: Emergency Medicine | Admitting: Emergency Medicine

## 2021-03-05 ENCOUNTER — Other Ambulatory Visit: Payer: Self-pay

## 2021-03-05 DIAGNOSIS — R918 Other nonspecific abnormal finding of lung field: Secondary | ICD-10-CM

## 2021-03-05 DIAGNOSIS — R911 Solitary pulmonary nodule: Secondary | ICD-10-CM | POA: Diagnosis not present

## 2021-03-05 MED ORDER — IOPAMIDOL (ISOVUE-300) INJECTION 61%
75.0000 mL | Freq: Once | INTRAVENOUS | Status: AC | PRN
  Administered 2021-03-05: 75 mL via INTRAVENOUS

## 2021-03-06 ENCOUNTER — Encounter (INDEPENDENT_AMBULATORY_CARE_PROVIDER_SITE_OTHER): Payer: Self-pay | Admitting: Family Medicine

## 2021-03-06 ENCOUNTER — Ambulatory Visit (INDEPENDENT_AMBULATORY_CARE_PROVIDER_SITE_OTHER): Payer: BC Managed Care – PPO | Admitting: Family Medicine

## 2021-03-06 ENCOUNTER — Other Ambulatory Visit: Payer: Self-pay

## 2021-03-06 VITALS — BP 102/64 | HR 62 | Temp 97.6°F | Ht 62.0 in | Wt 178.0 lb

## 2021-03-06 DIAGNOSIS — F39 Unspecified mood [affective] disorder: Secondary | ICD-10-CM | POA: Diagnosis not present

## 2021-03-06 DIAGNOSIS — Z6833 Body mass index (BMI) 33.0-33.9, adult: Secondary | ICD-10-CM

## 2021-03-06 DIAGNOSIS — E559 Vitamin D deficiency, unspecified: Secondary | ICD-10-CM | POA: Diagnosis not present

## 2021-03-06 DIAGNOSIS — R7303 Prediabetes: Secondary | ICD-10-CM

## 2021-03-06 DIAGNOSIS — I10 Essential (primary) hypertension: Secondary | ICD-10-CM

## 2021-03-06 DIAGNOSIS — E669 Obesity, unspecified: Secondary | ICD-10-CM

## 2021-03-06 DIAGNOSIS — Z9189 Other specified personal risk factors, not elsewhere classified: Secondary | ICD-10-CM

## 2021-03-06 DIAGNOSIS — E7849 Other hyperlipidemia: Secondary | ICD-10-CM

## 2021-03-06 DIAGNOSIS — D538 Other specified nutritional anemias: Secondary | ICD-10-CM

## 2021-03-06 MED ORDER — VITAMIN D (ERGOCALCIFEROL) 1.25 MG (50000 UNIT) PO CAPS: 50000.0000 [IU] | ORAL_CAPSULE | ORAL | 0 refills | Status: DC

## 2021-03-06 NOTE — Patient Instructions (Signed)
The 10-year ASCVD risk score (Arnett DK, et al., 2019) is: 1.8%   Values used to calculate the score:     Age: 55 years     Sex: Female     Is Non-Hispanic African American: No     Diabetic: No     Tobacco smoker: No     Systolic Blood Pressure: 102 mmHg     Is BP treated: Yes     HDL Cholesterol: 57 mg/dL     Total Cholesterol: 213 mg/dL

## 2021-03-10 NOTE — Progress Notes (Signed)
Chief Complaint:   OBESITY Casey Serrano is here to discuss her progress with her obesity treatment plan along with follow-up of her obesity related diagnoses. Casey Serrano is on the Category 1 Plan and states she is following her eating plan approximately 70% of the time. Casey Serrano states she is using stationary bike 35 minutes 3 times per week.  Today's visit was #: 2 Starting weight: 181 lbs Starting date: 02/20/2021 Today's weight: 178 lbs Today's date: 03/06/2021 Total lbs lost to date: 3 Total lbs lost since last in-office visit: 3  Interim History: Casey Serrano is here today for her first follow-up office visit since starting the program with [place].  All blood work/ lab tests that were recently ordered by myself or an outside provider were reviewed with patient today per their request.   Extended time was spent counseling her on all new disease processes that were discovered or preexisting ones that are affected by BMI.  she understands that many of these abnormalities will need to monitored regularly along with the current treatment plan of prudent dietary changes, in which we are making each and every office visit, to improve these health parameters. Casey Serrano is measuring proteins. She reports some hunger late mornings only. She denies cravings. It is difficult to eat entire amount of meat for lunch- eating 1/2.  Subjective:   1. Essential hypertension Discussed labs with patient today. At goal. Medication: Benicar  BP Readings from Last 3 Encounters:  03/06/21 102/64  02/20/21 109/75  10/02/20 118/74   Lab Results  Component Value Date   CREATININE 0.80 02/20/2021   2. Other hyperlipidemia Discussed labs with patient today. LDL 136. ASCVD 1.8%. Medication: Krill oil.  Lab Results  Component Value Date   CHOL 213 (H) 02/20/2021   HDL 57 02/20/2021   LDLCALC 136 (H) 02/20/2021   TRIG 111 02/20/2021   CHOLHDL 3.7 02/20/2021   Lab Results  Component Value Date   ALT 13 02/20/2021    AST 13 02/20/2021   ALKPHOS 52 02/20/2021   BILITOT 0.4 02/20/2021   The 10-year ASCVD risk score (Arnett DK, et al., 2019) is: 1.8%   Values used to calculate the score:     Age: 55 years     Sex: Female     Is Non-Hispanic African American: No     Diabetic: No     Tobacco smoker: No     Systolic Blood Pressure: 102 mmHg     Is BP treated: Yes     HDL Cholesterol: 57 mg/dL     Total Cholesterol: 213 mg/dL  3. Mood disorder (HCC) Discussed labs with patient today. Treated by GYN with Lexapro and Wellbutrin. Pt denies concerns.  4. Pre-diabetes New. Discussed labs with patient today. No previous diagnosis. Casey Serrano has a new diagnosis of pre-diabetes based on her elevated HgA1c and fasting insulin level. Pt was informed this puts her at greater risk of developing diabetes. She continues to work on diet and exercise to decrease her risk of diabetes. She denies nausea or hypoglycemia.  Lab Results  Component Value Date   HGBA1C 6.0 (H) 02/20/2021   Lab Results  Component Value Date   INSULIN 7.5 02/20/2021   5. Vitamin D deficiency Discussed labs with patient today. Not at goal. She is currently taking  a Calcium-Vit D supplement . She denies nausea, vomiting or muscle weakness.  Lab Results  Component Value Date   VD25OH 35.8 02/20/2021   6. Other specified nutritional anemias Discussed labs  with patient today. Not at goal. Medication: Multivitamin with iron in it QD.  CBC Latest Ref Rng & Units 02/20/2021  WBC 3.4 - 10.8 x10E3/uL 7.2  Hemoglobin 11.1 - 15.9 g/dL 56.3  Hematocrit 14.9 - 46.6 % 40.7  Platelets 150 - 450 x10E3/uL 413   No results found for: IRON, TIBC, FERRITIN Lab Results  Component Value Date   VITAMINB12 454 02/20/2021   7. At risk for diabetes mellitus Casey Serrano is at higher than average risk for developing diabetes due new onset pre-diabetes.   Assessment/Plan:  No orders of the defined types were placed in this encounter.   There are no  discontinued medications.   Meds ordered this encounter  Medications   Vitamin D, Ergocalciferol, (DRISDOL) 1.25 MG (50000 UNIT) CAPS capsule    Sig: Take 1 capsule (50,000 Units total) by mouth every 7 (seven) days.    Dispense:  4 capsule    Refill:  0    Ov for RF; one mo supply     1. Essential hypertension Casey Serrano is working on healthy weight loss and exercise to improve blood pressure control. We will watch for signs of hypotension as she continues her lifestyle modifications.  2. Other hyperlipidemia No need for meds. Decrease saturated and trans fats by following meal plan. Cardiovascular risk and specific lipid/LDL goals reviewed.  We discussed several lifestyle modifications today and [name] will continue to work on diet, exercise and weight loss efforts. Orders and follow up as documented in patient record. Recheck labs in 3-4 months.  Counseling Intensive lifestyle modifications are the first line treatment for this issue. Dietary changes: Increase soluble fiber. Decrease simple carbohydrates. Exercise changes: Moderate to vigorous-intensity aerobic activity 150 minutes per week if tolerated. Lipid-lowering medications: see documented in medical record.  3. Mood disorder (HCC) Denies emotional eating or concerns. Labs stable. Continue current medications. We will continue to monitor closely.  4. Pre-diabetes Pt wishes to focus on diet and avoid medications at this time. Casey Serrano will continue to work on weight loss, exercise, and decreasing simple carbohydrates to help decrease the risk of diabetes.   5. Vitamin D deficiency Plan: - Discussed importance of vitamin D to their health and well-being.  - possible symptoms of low Vitamin D can be low energy, depressed mood, muscle aches, joint aches, osteoporosis etc. - low Vitamin D levels may be linked to an increased risk of cardiovascular events and even increased risk of cancers- such as colon and breast.  - I recommend pt  take a 50,000 IU weekly prescription vit D - see script below   - Informed patient this may be a lifelong thing, and she was encouraged to continue to take the medicine until told otherwise.   - we will need to monitor levels regularly (every 3-4 mo on average) to keep levels within normal limits.  - weight loss will likely improve availability of vitamin D, thus encouraged Casey Serrano to continue with meal plan and their weight loss efforts to further improve this condition - pt's questions and concerns regarding this condition addressed.  Start- Vitamin D, Ergocalciferol, (DRISDOL) 1.25 MG (50000 UNIT) CAPS capsule; Take 1 capsule (50,000 Units total) by mouth every 7 (seven) days.  Dispense: 4 capsule; Refill: 0  6. Other specified nutritional anemias CBC within normal limits. No need for change in treatment plan. Orders and follow up as documented in patient record.  Counseling Iron is essential for our bodies to make red blood cells.  Reasons that someone may  be deficient include: an iron-deficient diet (more likely in those following vegan or vegetarian diets), women with heavy menses, patients with GI disorders or poor absorption, patients that have had bariatric surgery, frequent blood donors, patients with cancer, and patients with heart disease.   An iron supplement has been recommended. This is found over-the-counter.  Iron-rich foods include dark leafy greens, red and white meats, eggs, seafood, and beans.   Certain foods and drinks prevent your body from absorbing iron properly. Avoid eating these foods in the same meal as iron-rich foods or with iron supplements. These foods include: coffee, black tea, and red wine; milk, dairy products, and foods that are high in calcium; beans and soybeans; whole grains.  Constipation can be a side effect of iron supplementation. Increased water and fiber intake are helpful. Water goal: > 2 liters/day. Fiber goal: > 25 grams/day.  7. At risk for diabetes  mellitus Casey Serrano was given approximately 23 minutes of diabetes education and counseling today. We discussed intensive lifestyle modifications today with an emphasis on weight loss as well as increasing exercise and decreasing simple carbohydrates in her diet. We also reviewed medication options with an emphasis on risk versus benefit of those discussed.   Repetitive spaced learning was employed today to elicit superior memory formation and behavioral change.  8. Obesity with current BMI of 32.7  Casey Serrano is currently in the action stage of change. As such, her goal is to continue with weight loss efforts. She has agreed to the Category 1 Plan.   Increase water intake to 90 oz per day. Create 2 separate sandwiches (use low carb la Banderita tortillas). Eat earlier for lunch and break it into 2 mini meals.  Exercise goals:  As is  Behavioral modification strategies: increasing water intake, no skipping meals, and planning for success.  Casey Serrano has agreed to follow-up with our clinic in 2 weeks. She was informed of the importance of frequent follow-up visits to maximize her success with intensive lifestyle modifications for her multiple health conditions.   Objective:   Blood pressure 102/64, pulse 62, temperature 97.6 F (36.4 C), height 5\' 2"  (1.575 m), weight 178 lb (80.7 kg), SpO2 100 %. Body mass index is 32.56 kg/m.  General: Cooperative, alert, well developed, in no acute distress. HEENT: Conjunctivae and lids unremarkable. Cardiovascular: Regular rhythm.  Lungs: Normal work of breathing. Neurologic: No focal deficits.   Lab Results  Component Value Date   CREATININE 0.80 02/20/2021   BUN 12 02/20/2021   NA 141 02/20/2021   K 4.3 02/20/2021   CL 104 02/20/2021   CO2 24 02/20/2021   Lab Results  Component Value Date   ALT 13 02/20/2021   AST 13 02/20/2021   ALKPHOS 52 02/20/2021   BILITOT 0.4 02/20/2021   Lab Results  Component Value Date   HGBA1C 6.0 (H) 02/20/2021    Lab Results  Component Value Date   INSULIN 7.5 02/20/2021   Lab Results  Component Value Date   TSH 2.060 02/20/2021   Lab Results  Component Value Date   CHOL 213 (H) 02/20/2021   HDL 57 02/20/2021   LDLCALC 136 (H) 02/20/2021   TRIG 111 02/20/2021   CHOLHDL 3.7 02/20/2021   Lab Results  Component Value Date   VD25OH 35.8 02/20/2021   Lab Results  Component Value Date   WBC 7.2 02/20/2021   HGB 13.4 02/20/2021   HCT 40.7 02/20/2021   MCV 90 02/20/2021   PLT 413 02/20/2021  Attestation Statements:   Reviewed by clinician on day of visit: allergies, medications, problem list, medical history, surgical history, family history, social history, and previous encounter notes.  Coral Ceo, CMA, am acting as transcriptionist for [employer], DO.  I have reviewed the above documentation for accuracy and completeness, and I agree with the above. Marjory Sneddon, D.O.  The [place] was signed into law in 2016 which includes the topic of electronic health records.  This provides immediate access to information in MyChart.  This includes consultation notes, operative notes, office notes, lab results and pathology reports.  If you have any questions about what you read please let us know at your next visit so we can discuss your concerns and take corrective action if need be.  We are right here with you.

## 2021-03-11 ENCOUNTER — Ambulatory Visit: Payer: BC Managed Care – PPO | Admitting: Emergency Medicine

## 2021-03-11 ENCOUNTER — Other Ambulatory Visit (INDEPENDENT_AMBULATORY_CARE_PROVIDER_SITE_OTHER): Payer: Self-pay | Admitting: Family Medicine

## 2021-03-11 ENCOUNTER — Telehealth: Payer: Self-pay

## 2021-03-11 DIAGNOSIS — E559 Vitamin D deficiency, unspecified: Secondary | ICD-10-CM

## 2021-03-11 NOTE — Telephone Encounter (Signed)
ATC pt to reschedule OV for today. Left voicemail for pt to return call to office. If pt calls back please reschedule in first available OV slot.

## 2021-03-20 ENCOUNTER — Ambulatory Visit (INDEPENDENT_AMBULATORY_CARE_PROVIDER_SITE_OTHER): Payer: BC Managed Care – PPO | Admitting: Family Medicine

## 2021-03-20 ENCOUNTER — Other Ambulatory Visit: Payer: Self-pay

## 2021-03-20 ENCOUNTER — Encounter (INDEPENDENT_AMBULATORY_CARE_PROVIDER_SITE_OTHER): Payer: Self-pay | Admitting: Family Medicine

## 2021-03-20 VITALS — BP 115/76 | HR 79 | Temp 97.6°F | Ht 62.0 in | Wt 178.0 lb

## 2021-03-20 DIAGNOSIS — Z6833 Body mass index (BMI) 33.0-33.9, adult: Secondary | ICD-10-CM | POA: Diagnosis not present

## 2021-03-20 DIAGNOSIS — E669 Obesity, unspecified: Secondary | ICD-10-CM

## 2021-03-20 DIAGNOSIS — E559 Vitamin D deficiency, unspecified: Secondary | ICD-10-CM | POA: Diagnosis not present

## 2021-03-20 DIAGNOSIS — Z9189 Other specified personal risk factors, not elsewhere classified: Secondary | ICD-10-CM | POA: Diagnosis not present

## 2021-03-20 MED ORDER — VITAMIN D (ERGOCALCIFEROL) 1.25 MG (50000 UNIT) PO CAPS: 50000.0000 [IU] | ORAL_CAPSULE | ORAL | 0 refills | Status: AC

## 2021-03-20 NOTE — Progress Notes (Signed)
Chief Complaint:   OBESITY Casey Serrano is here to discuss her progress with her obesity treatment plan along with follow-up of her obesity related diagnoses. Casey Serrano is on the Category 1 Plan and states she is following her eating plan approximately 90% of the time. Casey Serrano states she is using recumbent bike 30 minutes 2-3 times per week.  Today's visit was #: 3 Starting weight: 181 lbs Starting date: 02/20/2021 Today's weight: 178 lbs Today's date: 03/20/2021 Total lbs lost to date: 3 Total lbs lost since last in-office visit: 3  Interim History: Casey Serrano states she still gets hungry mid-morning and at night after dinner. Pt hasn't made changes, per our discussion last OV.  Subjective:   1. Vitamin D deficiency She is currently taking prescription vitamin D 50,000 IU each week. She denies nausea, vomiting or muscle weakness.  Lab Results  Component Value Date   VD25OH 35.8 02/20/2021   2. At risk for deficient intake of food The patient is at a higher than average risk of deficient intake of food due to inadequate intake.  Assessment/Plan:   1. Vitamin D deficiency Low Vitamin D level contributes to fatigue and are associated with obesity, breast, and colon cancer. She agrees to continue to take prescription Vitamin D 50,000 IU every week and will follow-up for routine testing of Vitamin D, at least 2-3 times per year to avoid over-replacement.  Refill- Vitamin D, Ergocalciferol, (DRISDOL) 1.25 MG (50000 UNIT) CAPS capsule; Take 1 capsule (50,000 Units total) by mouth every 7 (seven) days.  Dispense: 4 capsule; Refill: 0  2. At risk for deficient intake of food Casey Serrano was given approximately 9 minutes of deficit intake of food prevention counseling today. Casey Serrano is at risk for eating too few calories based on current food recall. She was encouraged to focus on meeting caloric and protein goals according to her recommended meal plan.   3. Obesity with current BMI of 32.6  Casey Serrano is  currently in the action stage of change. As such, her goal is to continue with weight loss efforts. She has agreed to change to keeping a food journal and adhering to recommended goals of [phone] calories and 75+ grams protein.   Bring in journaling log to next OV.  Exercise goals:  As is  Behavioral modification strategies: increasing lean protein intake and no skipping meals.  Casey Serrano has agreed to follow-up with our clinic in 2 weeks. She was informed of the importance of frequent follow-up visits to maximize her success with intensive lifestyle modifications for her multiple health conditions.   Objective:   Blood pressure 115/76, pulse 79, temperature 97.6 F (36.4 C), height 5\' 2"  (1.575 m), weight 178 lb (80.7 kg), SpO2 98 %. Body mass index is 32.56 kg/m.  General: Cooperative, alert, well developed, in no acute distress. HEENT: Conjunctivae and lids unremarkable. Cardiovascular: Regular rhythm.  Lungs: Normal work of breathing. Neurologic: No focal deficits.   Lab Results  Component Value Date   CREATININE 0.80 02/20/2021   BUN 12 02/20/2021   NA 141 02/20/2021   K 4.3 02/20/2021   CL 104 02/20/2021   CO2 24 02/20/2021   Lab Results  Component Value Date   ALT 13 02/20/2021   AST 13 02/20/2021   ALKPHOS 52 02/20/2021   BILITOT 0.4 02/20/2021   Lab Results  Component Value Date   HGBA1C 6.0 (H) 02/20/2021   Lab Results  Component Value Date   INSULIN 7.5 02/20/2021   Lab Results  Component  Value Date   TSH 2.060 02/20/2021   Lab Results  Component Value Date   CHOL 213 (H) 02/20/2021   HDL 57 02/20/2021   LDLCALC 136 (H) 02/20/2021   TRIG 111 02/20/2021   CHOLHDL 3.7 02/20/2021   Lab Results  Component Value Date   VD25OH 35.8 02/20/2021   Lab Results  Component Value Date   WBC 7.2 02/20/2021   HGB 13.4 02/20/2021   HCT 40.7 02/20/2021   MCV 90 02/20/2021   PLT 413 02/20/2021    Attestation Statements:   Reviewed by clinician on day of  visit: allergies, medications, problem list, medical history, surgical history, family history, social history, and previous encounter notes.  Edmund Hilda, CMA, am acting as transcriptionist for [employer], DO.  I have reviewed the above documentation for accuracy and completeness, and I agree with the above. Carlye Grippe, D.O.  The 21st Century Cures Act was signed into law in 2016 which includes the topic of electronic health records.  This provides immediate access to information in MyChart.  This includes consultation notes, operative notes, office notes, lab results and pathology reports.  If you have any questions about what you read please let us know at your next visit so we can discuss your concerns and take corrective action if need be.  We are right here with you.

## 2021-03-24 DIAGNOSIS — I10 Essential (primary) hypertension: Secondary | ICD-10-CM | POA: Diagnosis not present

## 2021-03-31 ENCOUNTER — Encounter (INDEPENDENT_AMBULATORY_CARE_PROVIDER_SITE_OTHER): Payer: Self-pay

## 2021-04-02 ENCOUNTER — Encounter (INDEPENDENT_AMBULATORY_CARE_PROVIDER_SITE_OTHER): Payer: Self-pay

## 2021-04-02 ENCOUNTER — Ambulatory Visit (INDEPENDENT_AMBULATORY_CARE_PROVIDER_SITE_OTHER): Payer: BC Managed Care – PPO | Admitting: Family Medicine

## 2021-04-09 ENCOUNTER — Ambulatory Visit: Payer: BC Managed Care – PPO | Admitting: Emergency Medicine

## 2021-04-10 ENCOUNTER — Ambulatory Visit (INDEPENDENT_AMBULATORY_CARE_PROVIDER_SITE_OTHER): Payer: BC Managed Care – PPO | Admitting: Emergency Medicine

## 2021-04-10 ENCOUNTER — Encounter: Payer: Self-pay | Admitting: Emergency Medicine

## 2021-04-10 ENCOUNTER — Other Ambulatory Visit: Payer: Self-pay

## 2021-04-10 VITALS — BP 136/78 | HR 90 | Temp 98.2°F | Resp 18 | Ht 62.0 in | Wt 184.6 lb

## 2021-04-10 DIAGNOSIS — R918 Other nonspecific abnormal finding of lung field: Secondary | ICD-10-CM

## 2021-04-10 DIAGNOSIS — R911 Solitary pulmonary nodule: Secondary | ICD-10-CM

## 2021-04-10 NOTE — Assessment & Plan Note (Signed)
Right middle lobe pulmonary nodule is stable at 9 mm.  She needs a repeat scan in 6 months.  If it stable and March 2023 then we will follow it again in March 2024 to establish a full 2 years of stability.

## 2021-04-10 NOTE — Progress Notes (Signed)
Subjective:    Patient ID: Casey Serrano, female    DOB: 1965-10-15, 55 y.o.   MRN: [id number]  HPI 55 year old never smoker with a history of hypertension, GERD, H pylori.  She is referred today for evaluation of shortness of breath and newly identified pulmonary nodule. Had COVID in July 2021.   She feels well. No respiratory complaints.   She underwent a cardiac CT on 09/26/2020 which I have reviewed, showed an irregular right middle lobe nodule 9 x 11 mm, small pleural-based nodule in the anterior right middle lobe, small punctate peripheral right middle lobe nodules, small punctate left lower lobe nodules without any effusions.     ROV 04/10/21 --Casey Serrano returns today for evaluation follow-up of a small right middle lobe pulmonary nodule.  She is 55, never smoker with hypertension, GERD.  She had COVID-19 in July 2021.  She had a cardiac CT 09/26/2020 that showed an irregular right middle lobe nodule 9 x 11 mm with some other small punctate peripheral right middle lobe and left lower lobe nodules.  She reports no new issues. No respiratory sx.   CT scan of the chest 03/05/2021 reviewed by me, shows that the 9 mm right middle lobe pulmonary nodule is unchanged.  No other new nodules identified.   Review of Systems As per HPI  Past Medical History:  Diagnosis Date   Anemia    Anxiety    Back pain    Breast discharge    Depression    Fatigue    GERD (gastroesophageal reflux disease)    Hypertension    SOB (shortness of breath) on exertion      Family History  Problem Relation Age of Onset   Hypertension Mother    Emphysema Mother    Cancer Mother    Stroke Father    Hyperlipidemia Father    Hypertension Father    Diabetes Father    Heart disease Father    Kidney disease Father    Emphysema Maternal Grandmother    Cancer Maternal Grandmother    Emphysema Maternal Grandfather    Heart disease Paternal Grandmother    Heart disease Paternal Grandfather    Heart disease  Paternal Uncle    Cancer Paternal Uncle     Mom- gastric CA Others family have had breast CA, pancreatic CA.  No family hx lung CA  Social History   Socioeconomic History   Marital status: Married    Spouse name: Baldo Ash   Number of children: Not on file   Years of education: Not on file   Highest education level: Not on file  Occupational History   Occupation: Nurse  Tobacco Use   Smoking status: Never    Passive exposure: Yes   Smokeless tobacco: Never  Vaping Use   Vaping Use: Never used  Substance and Sexual Activity   Alcohol use: Yes    Alcohol/week: 2.0 - 3.0 standard drinks    Types: 2 - 3 Standard drinks or equivalent per week   Drug use: Never   Sexual activity: Yes    Partners: Male  Other Topics Concern   Not on file  Social History Narrative   Not on file   Social Determinants of Health   Financial Resource Strain: Not on file  Food Insecurity: Not on file  Transportation Needs: Not on file  Physical Activity: Not on file  Stress: Not on file  Social Connections: Not on file  Intimate Partner Violence: Not on file  From [facility] [place] [facility]. [facility] [place]  ?? Asbestos exposure through her father's work Never a positive TB test.  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  Significant 2nd hand tobacco smoke.  No military  No known mold exposure  No Known Allergies   Outpatient Medications Prior to Visit  Medication Sig Dispense Refill   ALPRAZolam (XANAX) 0.5 MG tablet alprazolam 0.5 mg tablet  TAKE 1 TABLET BY MOUTH THREE TIMES A DAY     buPROPion (WELLBUTRIN XL) 150 MG 24 hr tablet Take 150 mg by mouth daily.     Calcium Carbonate+Vitamin D 600-200 MG-UNIT TABS Take 1 tablet by mouth daily.     escitalopram (LEXAPRO) 10 MG tablet Take 10 mg by mouth daily.     estradiol (VIVELLE-DOT) 0.1 MG/24HR patch Place 1 patch onto the skin 2 (two) times a week.     ibuprofen (ADVIL) 200 MG tablet Take 200 mg by mouth every 6 (six) hours as needed.      Krill Oil 500 MG CAPS Take 1 capsule by mouth daily.     Multiple Vitamins-Minerals (HAIR SKIN AND NAILS FORMULA) TABS Take 1 tablet by mouth daily.     olmesartan (BENICAR) 20 MG tablet olmesartan 20 mg tablet   1 tablet every day by oral route.     pantoprazole (PROTONIX) 40 MG tablet Take 40 mg by mouth 2 (two) times daily.     progesterone (PROMETRIUM) 100 MG capsule progesterone micronized 100 mg capsule  TAKE 1 CAPSULE BY MOUTH EVERY DAY     valACYclovir (VALTREX) 1000 MG tablet Take 1,000 mg by mouth daily as needed.     Vitamin D, Ergocalciferol, (DRISDOL) 1.25 MG (50000 UNIT) CAPS capsule Take 1 capsule (50,000 Units total) by mouth every 7 (seven) days. 4 capsule 0   zolpidem (AMBIEN) 10 MG tablet zolpidem 10 mg tablet  TAKE 1 TABLET BY MOUTH EVERY DAY     No facility-administered medications prior to visit.          Objective:   Physical Exam  Vitals:   04/10/21 1620  BP: 136/78  Pulse: 90  Resp: 18  Temp: 98.2 F (36.8 C)  TempSrc: Oral  SpO2: 97%  Weight: 184 lb 9.6 oz (83.7 kg)  Height: 5\' 2"  (1.575 m)   Gen: Pleasant, well-nourished, in no distress,  normal affect  ENT: No lesions,  mouth clear,  oropharynx clear, no postnasal drip  Neck: No JVD, no stridor  Lungs: No use of accessory muscles, no crackles or wheezing on normal respiration, no wheeze on forced expiration  Cardiovascular: RRR, heart sounds normal, no murmur or gallops, no peripheral edema  Musculoskeletal: No deformities, no cyanosis or clubbing  Neuro: alert, awake, non focal  Skin: Warm, no lesions or rash     Assessment & Plan:  Pulmonary nodules Right middle lobe pulmonary nodule is stable at 9 mm.  She needs a repeat scan in 6 months.  If it stable and March 2023 then we will follow it again in March 2024 to establish a full 2 years of stability.   April 2024, MD, PhD 04/10/2021, 4:33 PM [facility] [phone] or if no answer before 7:00PM  call [phone] For any issues after 7:00PM please call eLink [phone]

## 2021-04-10 NOTE — Patient Instructions (Signed)
We will plan to repeat your CT scan of the chest in March 2023 to follow your small right middle lobe pulmonary nodule. Follow Dr. Delton Coombes in March after your CT so that we can review.  Call sooner if you have any questions or problems.

## 2021-04-22 ENCOUNTER — Ambulatory Visit (INDEPENDENT_AMBULATORY_CARE_PROVIDER_SITE_OTHER): Payer: BC Managed Care – PPO | Admitting: Family Medicine

## 2021-07-08 ENCOUNTER — Other Ambulatory Visit: Payer: Self-pay | Admitting: Obstetrics and Gynecology

## 2021-07-08 DIAGNOSIS — Z1231 Encounter for screening mammogram for malignant neoplasm of breast: Secondary | ICD-10-CM

## 2021-08-11 ENCOUNTER — Ambulatory Visit: Payer: BC Managed Care – PPO

## 2021-08-13 ENCOUNTER — Encounter: Payer: Self-pay | Admitting: Internal Medicine

## 2021-08-19 ENCOUNTER — Ambulatory Visit
Admission: RE | Admit: 2021-08-19 | Discharge: 2021-08-19 | Disposition: A | Discharge status: Home / Self Care | Payer: BC Managed Care – PPO | Source: Ambulatory Visit | Attending: Obstetrics and Gynecology | Admitting: Obstetrics and Gynecology

## 2021-08-19 DIAGNOSIS — Z1231 Encounter for screening mammogram for malignant neoplasm of breast: Secondary | ICD-10-CM

## 2021-09-12 ENCOUNTER — Ambulatory Visit (HOSPITAL_COMMUNITY): Payer: BC Managed Care – PPO

## 2021-10-22 DIAGNOSIS — Z6833 Body mass index (BMI) 33.0-33.9, adult: Secondary | ICD-10-CM | POA: Diagnosis not present

## 2021-10-22 DIAGNOSIS — Z01419 Encounter for gynecological examination (general) (routine) without abnormal findings: Secondary | ICD-10-CM | POA: Diagnosis not present

## 2021-10-29 ENCOUNTER — Encounter: Payer: Self-pay | Admitting: Internal Medicine

## 2021-10-29 ENCOUNTER — Ambulatory Visit (INDEPENDENT_AMBULATORY_CARE_PROVIDER_SITE_OTHER): Payer: BC Managed Care – PPO | Admitting: Internal Medicine

## 2021-10-29 VITALS — BP 136/78 | HR 76 | Temp 97.8°F | Resp 16 | Ht 62.0 in | Wt 183.0 lb

## 2021-10-29 DIAGNOSIS — R7303 Prediabetes: Secondary | ICD-10-CM

## 2021-10-29 DIAGNOSIS — I10 Essential (primary) hypertension: Secondary | ICD-10-CM

## 2021-10-29 DIAGNOSIS — K219 Gastro-esophageal reflux disease without esophagitis: Secondary | ICD-10-CM

## 2021-10-29 MED ORDER — PANTOPRAZOLE SODIUM 40 MG PO TBEC: 40.0000 mg | DELAYED_RELEASE_TABLET | Freq: Every day | ORAL | 1 refills | Status: DC

## 2021-10-29 NOTE — Progress Notes (Signed)
? ?Subjective:  ?Patient ID: Casey Serrano, female    DOB: 04-03-1966  Age: 56 y.o. MRN: [id number] ? ?CC: Hypertension and Gastroesophageal Reflux ? ? ?HPI ?Casey Serrano presents for f/up - ? ?She has a history of hypertension that was previously treated with an ARB.  She recently stopped taking the ARB and has been working on her lifestyle modifications.  She is active and denies chest pain, shortness of breath, diaphoresis, dizziness, or lightheadedness.  She occasionally takes Sudafed. ? ?History ?Casey Serrano has a past medical history of Anemia, Anxiety, Back pain, Breast discharge, Depression, Fatigue, GERD (gastroesophageal reflux disease), Hypertension, and SOB (shortness of breath) on exertion.  ? ?She has a past surgical history that includes Reduction mammaplasty (Bilateral); Breast biopsy (Left); Breast reduction surgery; Tonsillectomy (2004); Endometrial ablation (2004); and Carpal tunnel release (Bilateral, 2008).  ? ?Her family history includes Cancer in her maternal grandmother, mother, and paternal uncle; Diabetes in her father and sister; Emphysema in her maternal grandfather, maternal grandmother, and mother; Heart disease in her father, paternal grandfather, paternal grandmother, and paternal uncle; Hyperlipidemia in her father; Hypertension in her father and mother; Kidney disease in her father; Stroke in her father.She reports that she has never smoked. She has been exposed to tobacco smoke. She has never used smokeless tobacco. She reports current alcohol use of about 10.0 standard drinks per week. She reports that she does not use drugs. ? ?Outpatient Medications Prior to Visit  ?Medication Sig Dispense Refill  ? ALPRAZolam (XANAX) 0.5 MG tablet alprazolam 0.5 mg tablet ? TAKE 1 TABLET BY MOUTH THREE TIMES A DAY    ? Calcium Carbonate+Vitamin D 600-200 MG-UNIT TABS Take 1 tablet by mouth daily.    ? escitalopram (LEXAPRO) 10 MG tablet Take 10 mg by mouth daily.    ? ibuprofen (ADVIL) 200 MG  tablet Take 200 mg by mouth every 6 (six) hours as needed.    ? Krill Oil 500 MG CAPS Take 1 capsule by mouth daily.    ? Multiple Vitamins-Minerals (HAIR SKIN AND NAILS FORMULA) TABS Take 1 tablet by mouth daily.    ? progesterone (PROMETRIUM) 100 MG capsule progesterone micronized 100 mg capsule ? TAKE 1 CAPSULE BY MOUTH EVERY DAY    ? valACYclovir (VALTREX) 1000 MG tablet Take 1,000 mg by mouth daily as needed.    ? Vitamin D, Ergocalciferol, (DRISDOL) 1.25 MG (50000 UNIT) CAPS capsule Take 1 capsule (50,000 Units total) by mouth every 7 (seven) days. 4 capsule 0  ? zolpidem (AMBIEN) 10 MG tablet zolpidem 10 mg tablet ? TAKE 1 TABLET BY MOUTH EVERY DAY    ? pantoprazole (PROTONIX) 40 MG tablet Take 40 mg by mouth 2 (two) times daily.    ? buPROPion (WELLBUTRIN XL) 150 MG 24 hr tablet Take 150 mg by mouth daily.    ? estradiol (VIVELLE-DOT) 0.1 MG/24HR patch Place 1 patch onto the skin 2 (two) times a week.    ? olmesartan (BENICAR) 20 MG tablet olmesartan 20 mg tablet ?  1 tablet every day by oral route.    ? ?No facility-administered medications prior to visit.  ? ? ?ROS ?Review of Systems  ?Constitutional:  Negative for chills, diaphoresis, fatigue and unexpected weight change.  ?HENT: Negative.    ?Eyes: Negative.  Negative for visual disturbance.  ?Respiratory:  Negative for cough, shortness of breath and wheezing.   ?Cardiovascular:  Negative for chest pain, palpitations and leg swelling.  ?Gastrointestinal:  Negative for abdominal pain, constipation and  diarrhea.  ?Endocrine: Negative.   ?Genitourinary: Negative.   ?Musculoskeletal: Negative.  Negative for arthralgias.  ?Skin: Negative.   ?Neurological:  Negative for dizziness, weakness and light-headedness.  ?Hematological:  Negative for adenopathy. Does not bruise/bleed easily.  ?Psychiatric/Behavioral:  Positive for sleep disturbance. Negative for dysphoric mood. The patient is nervous/anxious.   ? ?Objective:  ?BP 136/78 (BP Location: Right Arm, Patient  Position: Sitting, Cuff Size: Large)   Pulse 76   Temp 97.8 ?F (36.6 ?C) (Oral)   Resp 16   Ht 5\' 2"  (1.575 m)   Wt 183 lb (83 kg)   SpO2 97%   BMI 33.47 kg/m?  ? ?Physical Exam ?Vitals reviewed.  ?Constitutional:   ?   Appearance: She is not ill-appearing.  ?HENT:  ?   Nose: Nose normal.  ?   Mouth/Throat:  ?   Mouth: Mucous membranes are moist.  ?Eyes:  ?   General: No scleral icterus. ?   Conjunctiva/sclera: Conjunctivae normal.  ?Cardiovascular:  ?   Rate and Rhythm: Normal rate and regular rhythm.  ?   Heart sounds: No murmur heard. ?Pulmonary:  ?   Effort: Pulmonary effort is normal.  ?   Breath sounds: No stridor. No wheezing, rhonchi or rales.  ?Abdominal:  ?   General: Abdomen is flat.  ?   Palpations: There is no mass.  ?   Tenderness: There is no abdominal tenderness. There is no guarding.  ?   Hernia: No hernia is present.  ?Musculoskeletal:     ?   General: Normal range of motion.  ?   Cervical back: Neck supple.  ?   Right lower leg: No edema.  ?   Left lower leg: No edema.  ?Lymphadenopathy:  ?   Cervical: No cervical adenopathy.  ?Skin: ?   General: Skin is warm and dry.  ?Neurological:  ?   General: No focal deficit present.  ?   Mental Status: She is alert.  ?Psychiatric:     ?   Mood and Affect: Mood normal.     ?   Behavior: Behavior normal.  ? ? ?Lab Results  ?Component Value Date  ? WBC 7.2 02/20/2021  ? HGB 13.4 02/20/2021  ? HCT 40.7 02/20/2021  ? PLT 413 02/20/2021  ? GLUCOSE 97 02/20/2021  ? CHOL 213 (H) 02/20/2021  ? TRIG 111 02/20/2021  ? HDL 57 02/20/2021  ? LDLCALC 136 (H) 02/20/2021  ? ALT 13 02/20/2021  ? AST 13 02/20/2021  ? NA 141 02/20/2021  ? K 4.3 02/20/2021  ? CL 104 02/20/2021  ? CREATININE 0.80 02/20/2021  ? BUN 12 02/20/2021  ? CO2 24 02/20/2021  ? TSH 2.060 02/20/2021  ? HGBA1C 6.0 (H) 02/20/2021  ?  ? ?Assessment & Plan:  ? ?Casey Serrano was seen today for hypertension and gastroesophageal reflux. ? ?Diagnoses and all orders for this visit: ? ?Gastroesophageal reflux  disease without esophagitis- Her symptoms are well controlled with the PPI. ?-     pantoprazole (PROTONIX) 40 MG tablet; Take 1 tablet (40 mg total) by mouth daily. ? ?Primary hypertension- Her blood pressure is adequately well controlled with lifestyle modifications. ? ?Prediabetes- She is working on her lifestyle modifications. ? ? ?I have discontinued Casey Messier. Serrano's buPROPion, olmesartan, and estradiol. I have also changed her pantoprazole. Additionally, I am having her maintain her Calcium Carbonate+Vitamin D, escitalopram, progesterone, zolpidem, valACYclovir, ALPRAZolam, Krill Oil, Hair Skin and Nails Formula, ibuprofen, and Vitamin D (Ergocalciferol). ? ?Meds ordered this encounter  ?  Medications  ? pantoprazole (PROTONIX) 40 MG tablet  ?  Sig: Take 1 tablet (40 mg total) by mouth daily.  ?  Dispense:  90 tablet  ?  Refill:  1  ? ? ? ?Follow-up: Return in about 6 months (around 05/01/2022). ? ?Sanda Lingerhomas Madelene Kaatz, MD ?

## 2021-10-29 NOTE — Patient Instructions (Signed)
Hypertension, Adult High blood pressure (hypertension) is when the force of blood pumping through the arteries is too strong. The arteries are the blood vessels that carry blood from the heart throughout the body. Hypertension forces the heart to work harder to pump blood and may cause arteries to become narrow or stiff. Untreated or uncontrolled hypertension can lead to a heart attack, heart failure, a stroke, kidney disease, and other problems. A blood pressure reading consists of a higher number over a lower number. Ideally, your blood pressure should be below 120/80. The first ("top") number is called the systolic pressure. It is a measure of the pressure in your arteries as your heart beats. The second ("bottom") number is called the diastolic pressure. It is a measure of the pressure in your arteries as the heart relaxes. What are the causes? The exact cause of this condition is not known. There are some conditions that result in high blood pressure. What increases the risk? Certain factors may make you more likely to develop high blood pressure. Some of these risk factors are under your control, including: Smoking. Not getting enough exercise or physical activity. Being overweight. Having too much fat, sugar, calories, or salt (sodium) in your diet. Drinking too much alcohol. Other risk factors include: Having a personal history of heart disease, diabetes, high cholesterol, or kidney disease. Stress. Having a family history of high blood pressure and high cholesterol. Having obstructive sleep apnea. Age. The risk increases with age. What are the signs or symptoms? High blood pressure may not cause symptoms. Very high blood pressure (hypertensive crisis) may cause: Headache. Fast or irregular heartbeats (palpitations). Shortness of breath. Nosebleed. Nausea and vomiting. Vision changes. Severe chest pain, dizziness, and seizures. How is this diagnosed? This condition is diagnosed by  measuring your blood pressure while you are seated, with your arm resting on a flat surface, your legs uncrossed, and your feet flat on the floor. The cuff of the blood pressure monitor will be placed directly against the skin of your upper arm at the level of your heart. Blood pressure should be measured at least twice using the same arm. Certain conditions can cause a difference in blood pressure between your right and left arms. If you have a high blood pressure reading during one visit or you have normal blood pressure with other risk factors, you may be asked to: Return on a different day to have your blood pressure checked again. Monitor your blood pressure at home for 1 week or longer. If you are diagnosed with hypertension, you may have other blood or imaging tests to help your health care provider understand your overall risk for other conditions. How is this treated? This condition is treated by making healthy lifestyle changes, such as eating healthy foods, exercising more, and reducing your alcohol intake. You may be referred for counseling on a healthy diet and physical activity. Your health care provider may prescribe medicine if lifestyle changes are not enough to get your blood pressure under control and if: Your systolic blood pressure is above 130. Your diastolic blood pressure is above 80. Your personal target blood pressure may vary depending on your medical conditions, your age, and other factors. Follow these instructions at home: Eating and drinking  Eat a diet that is high in fiber and potassium, and low in sodium, added sugar, and fat. An example of this eating plan is called the DASH diet. DASH stands for Dietary Approaches to Stop Hypertension. To eat this way: Eat   plenty of fresh fruits and vegetables. Try to fill one half of your plate at each meal with fruits and vegetables. Eat whole grains, such as whole-wheat pasta, brown rice, or whole-grain bread. Fill about one  fourth of your plate with whole grains. Eat or drink low-fat dairy products, such as skim milk or low-fat yogurt. Avoid fatty cuts of meat, processed or cured meats, and poultry with skin. Fill about one fourth of your plate with lean proteins, such as fish, chicken without skin, beans, eggs, or tofu. Avoid pre-made and processed foods. These tend to be higher in sodium, added sugar, and fat. Reduce your daily sodium intake. Many people with hypertension should eat less than 1,500 mg of sodium a day. Do not drink alcohol if: Your health care provider tells you not to drink. You are pregnant, may be pregnant, or are planning to become pregnant. If you drink alcohol: Limit how much you have to: 0-1 drink a day for women. 0-2 drinks a day for men. Know how much alcohol is in your drink. In the [place] one drink equals one 12 oz bottle of beer (355 mL), one 5 oz glass of wine (148 mL), or one 1 oz glass of hard liquor (44 mL). Lifestyle  Work with your health care provider to maintain a healthy body weight or to lose weight. Ask what an ideal weight is for you. Get at least 30 minutes of exercise that causes your heart to beat faster (aerobic exercise) most days of the week. Activities may include walking, swimming, or biking. Include exercise to strengthen your muscles (resistance exercise), such as Pilates or lifting weights, as part of your weekly exercise routine. Try to do these types of exercises for 30 minutes at least 3 days a week. Do not use any products that contain nicotine or tobacco. These products include cigarettes, chewing tobacco, and vaping devices, such as e-cigarettes. If you need help quitting, ask your health care provider. Monitor your blood pressure at home as told by your health care provider. Keep all follow-up visits. This is important. Medicines Take over-the-counter and prescription medicines only as told by your health care provider. Follow directions carefully. Blood  pressure medicines must be taken as prescribed. Do not skip doses of blood pressure medicine. Doing this puts you at risk for problems and can make the medicine less effective. Ask your health care provider about side effects or reactions to medicines that you should watch for. Contact a health care provider if you: Think you are having a reaction to a medicine you are taking. Have headaches that keep coming back (recurring). Feel dizzy. Have swelling in your ankles. Have trouble with your vision. Get help right away if you: Develop a severe headache or confusion. Have unusual weakness or numbness. Feel faint. Have severe pain in your chest or abdomen. Vomit repeatedly. Have trouble breathing. These symptoms may be an emergency. Get help right away. Call 911. Do not wait to see if the symptoms will go away. Do not drive yourself to the hospital. Summary Hypertension is when the force of blood pumping through your arteries is too strong. If this condition is not controlled, it may put you at risk for serious complications. Your personal target blood pressure may vary depending on your medical conditions, your age, and other factors. For most people, a normal blood pressure is less than 120/80. Hypertension is treated with lifestyle changes, medicines, or a combination of both. Lifestyle changes include losing weight, eating a healthy,   low-sodium diet, exercising more, and limiting alcohol. This information is not intended to replace advice given to you by your health care provider. Make sure you discuss any questions you have with your health care provider. Document Revised: 04/22/2021 Document Reviewed: 04/22/2021 Elsevier Patient Education  2023 [employer]vier Inc.  

## 2021-10-30 DIAGNOSIS — Z13820 Encounter for screening for osteoporosis: Secondary | ICD-10-CM | POA: Diagnosis not present

## 2021-10-31 ENCOUNTER — Encounter: Payer: Self-pay | Admitting: Internal Medicine

## 2021-10-31 DIAGNOSIS — K219 Gastro-esophageal reflux disease without esophagitis: Secondary | ICD-10-CM | POA: Insufficient documentation

## 2021-10-31 DIAGNOSIS — I10 Essential (primary) hypertension: Secondary | ICD-10-CM | POA: Insufficient documentation

## 2021-10-31 DIAGNOSIS — R7303 Prediabetes: Secondary | ICD-10-CM | POA: Insufficient documentation

## 2021-11-19 LAB — LIPID PANEL
Cholesterol: 233 — AB (ref 0–200)
HDL: 71 — AB (ref 35–70)
LDL Cholesterol: 144
Triglycerides: 79 (ref 40–160)

## 2021-12-23 ENCOUNTER — Encounter: Payer: Self-pay | Admitting: Internal Medicine

## 2021-12-25 ENCOUNTER — Ambulatory Visit (INDEPENDENT_AMBULATORY_CARE_PROVIDER_SITE_OTHER): Payer: BC Managed Care – PPO | Admitting: Internal Medicine

## 2021-12-25 ENCOUNTER — Encounter: Payer: Self-pay | Admitting: Internal Medicine

## 2021-12-25 VITALS — BP 146/82 | HR 76 | Temp 98.0°F | Ht 62.0 in | Wt 182.0 lb

## 2021-12-25 DIAGNOSIS — I10 Essential (primary) hypertension: Secondary | ICD-10-CM

## 2021-12-25 DIAGNOSIS — R7303 Prediabetes: Secondary | ICD-10-CM | POA: Diagnosis not present

## 2021-12-25 DIAGNOSIS — R002 Palpitations: Secondary | ICD-10-CM | POA: Diagnosis not present

## 2021-12-25 DIAGNOSIS — E269 Hyperaldosteronism, unspecified: Secondary | ICD-10-CM

## 2021-12-25 DIAGNOSIS — I4892 Unspecified atrial flutter: Secondary | ICD-10-CM

## 2021-12-25 LAB — BASIC METABOLIC PANEL
BUN: 12 mg/dL (ref 6–23)
CO2: 26 mEq/L (ref 19–32)
Calcium: 9.1 mg/dL (ref 8.4–10.5)
Chloride: 102 mEq/L (ref 96–112)
Creatinine, Ser: 0.86 mg/dL (ref 0.40–1.20)
GFR: 75.65 mL/min (ref >60.00)
Glucose, Bld: 80 mg/dL (ref 70–99)
Potassium: 3.4 mEq/L — ABNORMAL LOW (ref 3.5–5.1)
Sodium: 138 mEq/L (ref 135–145)

## 2021-12-25 LAB — HEMOGLOBIN A1C: Hgb A1c MFr Bld: 6.2 % (ref 4.6–6.5)

## 2021-12-25 LAB — TSH: TSH: 3.46 u[IU]/mL (ref 0.35–5.50)

## 2021-12-25 MED ORDER — OLMESARTAN MEDOXOMIL 20 MG PO TABS: 20.0000 mg | ORAL_TABLET | Freq: Every day | ORAL | 1 refills | Status: DC

## 2021-12-25 NOTE — Patient Instructions (Signed)
Hypertension, Adult High blood pressure (hypertension) is when the force of blood pumping through the arteries is too strong. The arteries are the blood vessels that carry blood from the heart throughout the body. Hypertension forces the heart to work harder to pump blood and may cause arteries to become narrow or stiff. Untreated or uncontrolled hypertension can lead to a heart attack, heart failure, a stroke, kidney disease, and other problems. A blood pressure reading consists of a higher number over a lower number. Ideally, your blood pressure should be below 120/80. The first ("top") number is called the systolic pressure. It is a measure of the pressure in your arteries as your heart beats. The second ("bottom") number is called the diastolic pressure. It is a measure of the pressure in your arteries as the heart relaxes. What are the causes? The exact cause of this condition is not known. There are some conditions that result in high blood pressure. What increases the risk? Certain factors may make you more likely to develop high blood pressure. Some of these risk factors are under your control, including: Smoking. Not getting enough exercise or physical activity. Being overweight. Having too much fat, sugar, calories, or salt (sodium) in your diet. Drinking too much alcohol. Other risk factors include: Having a personal history of heart disease, diabetes, high cholesterol, or kidney disease. Stress. Having a family history of high blood pressure and high cholesterol. Having obstructive sleep apnea. Age. The risk increases with age. What are the signs or symptoms? High blood pressure may not cause symptoms. Very high blood pressure (hypertensive crisis) may cause: Headache. Fast or irregular heartbeats (palpitations). Shortness of breath. Nosebleed. Nausea and vomiting. Vision changes. Severe chest pain, dizziness, and seizures. How is this diagnosed? This condition is diagnosed by  measuring your blood pressure while you are seated, with your arm resting on a flat surface, your legs uncrossed, and your feet flat on the floor. The cuff of the blood pressure monitor will be placed directly against the skin of your upper arm at the level of your heart. Blood pressure should be measured at least twice using the same arm. Certain conditions can cause a difference in blood pressure between your right and left arms. If you have a high blood pressure reading during one visit or you have normal blood pressure with other risk factors, you may be asked to: Return on a different day to have your blood pressure checked again. Monitor your blood pressure at home for 1 week or longer. If you are diagnosed with hypertension, you may have other blood or imaging tests to help your health care provider understand your overall risk for other conditions. How is this treated? This condition is treated by making healthy lifestyle changes, such as eating healthy foods, exercising more, and reducing your alcohol intake. You may be referred for counseling on a healthy diet and physical activity. Your health care provider may prescribe medicine if lifestyle changes are not enough to get your blood pressure under control and if: Your systolic blood pressure is above 130. Your diastolic blood pressure is above 80. Your personal target blood pressure may vary depending on your medical conditions, your age, and other factors. Follow these instructions at home: Eating and drinking  Eat a diet that is high in fiber and potassium, and low in sodium, added sugar, and fat. An example of this eating plan is called the DASH diet. DASH stands for Dietary Approaches to Stop Hypertension. To eat this way: Eat   plenty of fresh fruits and vegetables. Try to fill one half of your plate at each meal with fruits and vegetables. Eat whole grains, such as whole-wheat pasta, brown rice, or whole-grain bread. Fill about one  fourth of your plate with whole grains. Eat or drink low-fat dairy products, such as skim milk or low-fat yogurt. Avoid fatty cuts of meat, processed or cured meats, and poultry with skin. Fill about one fourth of your plate with lean proteins, such as fish, chicken without skin, beans, eggs, or tofu. Avoid pre-made and processed foods. These tend to be higher in sodium, added sugar, and fat. Reduce your daily sodium intake. Many people with hypertension should eat less than 1,500 mg of sodium a day. Do not drink alcohol if: Your health care provider tells you not to drink. You are pregnant, may be pregnant, or are planning to become pregnant. If you drink alcohol: Limit how much you have to: 0-1 drink a day for women. 0-2 drinks a day for men. Know how much alcohol is in your drink. In the [place] one drink equals one 12 oz bottle of beer (355 mL), one 5 oz glass of wine (148 mL), or one 1 oz glass of hard liquor (44 mL). Lifestyle  Work with your health care provider to maintain a healthy body weight or to lose weight. Ask what an ideal weight is for you. Get at least 30 minutes of exercise that causes your heart to beat faster (aerobic exercise) most days of the week. Activities may include walking, swimming, or biking. Include exercise to strengthen your muscles (resistance exercise), such as Pilates or lifting weights, as part of your weekly exercise routine. Try to do these types of exercises for 30 minutes at least 3 days a week. Do not use any products that contain nicotine or tobacco. These products include cigarettes, chewing tobacco, and vaping devices, such as e-cigarettes. If you need help quitting, ask your health care provider. Monitor your blood pressure at home as told by your health care provider. Keep all follow-up visits. This is important. Medicines Take over-the-counter and prescription medicines only as told by your health care provider. Follow directions carefully. Blood  pressure medicines must be taken as prescribed. Do not skip doses of blood pressure medicine. Doing this puts you at risk for problems and can make the medicine less effective. Ask your health care provider about side effects or reactions to medicines that you should watch for. Contact a health care provider if you: Think you are having a reaction to a medicine you are taking. Have headaches that keep coming back (recurring). Feel dizzy. Have swelling in your ankles. Have trouble with your vision. Get help right away if you: Develop a severe headache or confusion. Have unusual weakness or numbness. Feel faint. Have severe pain in your chest or abdomen. Vomit repeatedly. Have trouble breathing. These symptoms may be an emergency. Get help right away. Call 911. Do not wait to see if the symptoms will go away. Do not drive yourself to the hospital. Summary Hypertension is when the force of blood pumping through your arteries is too strong. If this condition is not controlled, it may put you at risk for serious complications. Your personal target blood pressure may vary depending on your medical conditions, your age, and other factors. For most people, a normal blood pressure is less than 120/80. Hypertension is treated with lifestyle changes, medicines, or a combination of both. Lifestyle changes include losing weight, eating a healthy,   low-sodium diet, exercising more, and limiting alcohol. This information is not intended to replace advice given to you by your health care provider. Make sure you discuss any questions you have with your health care provider. Document Revised: 04/22/2021 Document Reviewed: 04/22/2021 Elsevier Patient Education  2023 [employer]vier Inc.  

## 2021-12-25 NOTE — Progress Notes (Signed)
Subjective:  Patient ID: Casey Serrano, female    DOB: 07-21-1965  Age: 56 y.o. MRN: [id number]  CC: Hypertension   HPI Casey Serrano presents for f/up -  She complains that her blood pressure is not well controlled and she has had a few headaches.  She complains of a 44-month history of palpitations that she described as skipped beats.  The palpitations are exacerbated by anxiety and stress but she denies chest pain, shortness of breath, diaphoresis, dizziness, lightheadedness, or near syncope.  Outpatient Medications Prior to Visit  Medication Sig Dispense Refill   ALPRAZolam (XANAX) 0.5 MG tablet alprazolam 0.5 mg tablet  TAKE 1 TABLET BY MOUTH THREE TIMES A DAY     Calcium Carbonate+Vitamin D 600-200 MG-UNIT TABS Take 1 tablet by mouth daily.     escitalopram (LEXAPRO) 10 MG tablet Take 10 mg by mouth daily.     ibuprofen (ADVIL) 200 MG tablet Take 200 mg by mouth every 6 (six) hours as needed.     Krill Oil 500 MG CAPS Take 1 capsule by mouth daily.     Multiple Vitamins-Minerals (HAIR SKIN AND NAILS FORMULA) TABS Take 1 tablet by mouth daily.     pantoprazole (PROTONIX) 40 MG tablet Take 1 tablet (40 mg total) by mouth daily. 90 tablet 1   progesterone (PROMETRIUM) 100 MG capsule progesterone micronized 100 mg capsule  TAKE 1 CAPSULE BY MOUTH EVERY DAY     valACYclovir (VALTREX) 1000 MG tablet Take 1,000 mg by mouth daily as needed.     Vitamin D, Ergocalciferol, (DRISDOL) 1.25 MG (50000 UNIT) CAPS capsule Take 1 capsule (50,000 Units total) by mouth every 7 (seven) days. 4 capsule 0   zolpidem (AMBIEN) 10 MG tablet zolpidem 10 mg tablet  TAKE 1 TABLET BY MOUTH EVERY DAY     No facility-administered medications prior to visit.    ROS Review of Systems  Constitutional:  Negative for chills, diaphoresis, fatigue and fever.  HENT: Negative.    Eyes: Negative.   Respiratory:  Negative for cough, chest tightness, shortness of breath and wheezing.   Cardiovascular:  Positive  for palpitations. Negative for chest pain and leg swelling.  Gastrointestinal:  Negative for abdominal pain, constipation, diarrhea, nausea and vomiting.  Endocrine: Negative.   Genitourinary: Negative.  Negative for difficulty urinating and dysuria.  Musculoskeletal: Negative.  Negative for arthralgias and myalgias.  Skin: Negative.  Negative for color change.  Neurological: Negative.  Negative for dizziness, weakness and light-headedness.  Hematological:  Negative for adenopathy. Does not bruise/bleed easily.  Psychiatric/Behavioral:  Positive for sleep disturbance. Negative for decreased concentration, dysphoric mood, self-injury and suicidal ideas. The patient is nervous/anxious.     Objective:  BP (!) 146/82 (BP Location: Right Arm, Patient Position: Sitting, Cuff Size: Large)   Pulse 76   Temp 98 F (36.7 C) (Oral)   Ht 5\' 2"  (1.575 m)   Wt 182 lb (82.6 kg)   SpO2 98%   BMI 33.29 kg/m   BP Readings from Last 3 Encounters:  12/25/21 (!) 146/82  10/29/21 136/78  04/10/21 136/78    Wt Readings from Last 3 Encounters:  12/25/21 182 lb (82.6 kg)  10/29/21 183 lb (83 kg)  04/10/21 184 lb 9.6 oz (83.7 kg)    Physical Exam Vitals reviewed.  HENT:     Nose: Nose normal.     Mouth/Throat:     Mouth: Mucous membranes are moist.  Eyes:     General: No scleral icterus.  Conjunctiva/sclera: Conjunctivae normal.  Cardiovascular:     Rate and Rhythm: Normal rate and regular rhythm.     Heart sounds: Normal heart sounds, S1 normal and S2 normal. No murmur heard.    No gallop.     Comments: EKG- NSR, 72 bpm Normal EKG Pulmonary:     Effort: Pulmonary effort is normal.     Breath sounds: No stridor. No wheezing, rhonchi or rales.  Abdominal:     General: Abdomen is flat.     Palpations: There is no mass.     Tenderness: There is no abdominal tenderness. There is no guarding.     Hernia: No hernia is present.  Musculoskeletal:     Cervical back: Neck supple.     Right  lower leg: No edema.     Left lower leg: No edema.  Lymphadenopathy:     Cervical: No cervical adenopathy.  Skin:    General: Skin is warm and dry.  Neurological:     General: No focal deficit present.     Mental Status: She is alert.  Psychiatric:        Mood and Affect: Mood normal.        Behavior: Behavior normal.     Lab Results  Component Value Date   WBC 7.2 02/20/2021   HGB 13.4 02/20/2021   HCT 40.7 02/20/2021   PLT 413 02/20/2021   GLUCOSE 80 12/25/2021   CHOL 233 (A) 11/19/2021   TRIG 79 11/19/2021   HDL 71 (A) 11/19/2021   LDLCALC 144 11/19/2021   ALT 13 02/20/2021   AST 13 02/20/2021   NA 138 12/25/2021   K 3.4 (L) 12/25/2021   CL 102 12/25/2021   CREATININE 0.86 12/25/2021   BUN 12 12/25/2021   CO2 26 12/25/2021   TSH 3.46 12/25/2021   HGBA1C 6.2 12/25/2021    MM 3D SCREEN BREAST BILATERAL  Result Date: 08/19/2021 CLINICAL DATA:  Screening. EXAM: DIGITAL SCREENING BILATERAL MAMMOGRAM WITH TOMOSYNTHESIS AND CAD TECHNIQUE: Bilateral screening digital craniocaudal and mediolateral oblique mammograms were obtained. Bilateral screening digital breast tomosynthesis was performed. The images were evaluated with computer-aided detection. COMPARISON:  Previous exam(s). ACR Breast Density Category b: There are scattered areas of fibroglandular density. FINDINGS: There are no findings suspicious for malignancy. IMPRESSION: No mammographic evidence of malignancy. A result letter of this screening mammogram will be mailed directly to the patient. RECOMMENDATION: Screening mammogram in one year. (Code:SM-B-01Y) BI-RADS CATEGORY  1: Negative. Electronically Signed   By: Gerome Sam III M.D.   On: 08/19/2021 19:38    Assessment & Plan:   [name] was seen today for hypertension.  Diagnoses and all orders for this visit:  Primary hypertension- Her blood pressure is not well controlled and she is hypokalemic.  Will evaluate for secondary causes of hypertension and will  start an ARB. -     Basic metabolic panel; Future -     TSH; Future -     Aldosterone + renin activity w/ ratio; Future -     Aldosterone + renin activity w/ ratio -     TSH -     Basic metabolic panel -     olmesartan (BENICAR) 20 MG tablet; Take 1 tablet (20 mg total) by mouth daily.  Heart palpitations- With the exception of hypokalemia her labs are reassuring.  I recommended that she wear a heart monitor. -     Basic metabolic panel; Future -     TSH; Future -  TSH -     Basic metabolic panel -     CARDIAC EVENT MONITOR; Future  Prediabetes- Her A1c remains at 6.2%. -     Basic metabolic panel; Future -     Hemoglobin A1c; Future -     Hemoglobin A1c -     Basic metabolic panel   I am having Casey Serrano start on olmesartan. I am also having her maintain her Calcium Carbonate+Vitamin D, escitalopram, progesterone, zolpidem, valACYclovir, ALPRAZolam, Krill Oil, Hair Skin and Nails Formula, ibuprofen, Vitamin D (Ergocalciferol), and pantoprazole.  Meds ordered this encounter  Medications   olmesartan (BENICAR) 20 MG tablet    Sig: Take 1 tablet (20 mg total) by mouth daily.    Dispense:  90 tablet    Refill:  1     Follow-up: Return in about 3 months (around 03/27/2022).  Sanda Linger, MD

## 2021-12-31 ENCOUNTER — Ambulatory Visit (INDEPENDENT_AMBULATORY_CARE_PROVIDER_SITE_OTHER): Payer: BC Managed Care – PPO

## 2021-12-31 ENCOUNTER — Encounter: Payer: Self-pay | Admitting: Internal Medicine

## 2021-12-31 DIAGNOSIS — R002 Palpitations: Secondary | ICD-10-CM | POA: Diagnosis not present

## 2022-01-05 NOTE — Addendum Note (Signed)
Addended by: Darryll Capers on: 01/05/2022 10:44 AM   Modules accepted: Orders

## 2022-01-06 DIAGNOSIS — E269 Hyperaldosteronism, unspecified: Secondary | ICD-10-CM | POA: Insufficient documentation

## 2022-01-06 LAB — ALDOSTERONE + RENIN ACTIVITY W/ RATIO
ALDO / PRA Ratio: 41.2 Ratio — ABNORMAL HIGH (ref 0.9–28.9)
Aldosterone: 7 ng/dL
Renin Activity: 0.17 ng/mL/h — ABNORMAL LOW (ref 0.25–5.82)

## 2022-01-06 MED ORDER — SPIRONOLACTONE 25 MG PO TABS: 25.0000 mg | ORAL_TABLET | Freq: Every day | ORAL | 0 refills | Status: DC

## 2022-01-06 NOTE — Addendum Note (Signed)
Addended by: Etta Grandchild on: 01/06/2022 04:56 PM   Modules accepted: Orders

## 2022-01-08 ENCOUNTER — Telehealth: Payer: Self-pay | Admitting: *Deleted

## 2022-01-08 ENCOUNTER — Telehealth: Payer: Self-pay

## 2022-01-08 NOTE — Telephone Encounter (Addendum)
   Cardiac Monitor Alert  Date of alert:  01/08/2022   Patient Name: Casey Serrano  DOB: 20-Apr-1966  MRN: [id number]   [facility] Cardiologist: None  [facility] EP:  None    Monitor Information: Cardiac Event Monitor [[employer]]  Reason:  Palpitations Ordering provider:  Sheryle Spray   Alert Atrial Fibrillation/Flutter This is the 1st alert for this rhythm.  The patient has no hx of Atrial Fibrillation/Flutter.  The patient is not currently on anticoagulation.  Next Cardiology Appointment   None scheduled  The patient could NOT be reached by telephone today.  Attempted phone call to pt and left voicemail message to contact [facility] at [phone]. Arrhythmia, symptoms and history reviewed with Dr Ladona Ridgel, DOD.  Plan:  Dr Ladona Ridgel reviewed monitor alert  and states it is normal sinus rhythm with noise artifact.  Continue monitoring.   Other: Pt returned RN's call and advised of alert received and per Dr Ladona Ridgel monitor is NSR with noise artifact and continue monitoring.  Pt states she removed monitor this morning as she thought she was finished monitoring.  Pt advised monitor was ordered for 30 days.  Pt states she can contact company to request new adhesive strips. Will make our monitoring team aware monitor has been removed.   Alois Cliche, RN  01/08/2022 8:24 AM

## 2022-01-08 NOTE — Telephone Encounter (Signed)
See previous phone note.  

## 2022-01-08 NOTE — Telephone Encounter (Signed)
[employer] end of service date on your cardiac event monitor is 01/29/2022.   I will contact [employer] to ship additional strips to your address on file.

## 2022-01-13 ENCOUNTER — Encounter: Payer: Self-pay | Admitting: Internal Medicine

## 2022-01-25 ENCOUNTER — Other Ambulatory Visit: Payer: Self-pay | Admitting: Internal Medicine

## 2022-01-25 DIAGNOSIS — I4892 Unspecified atrial flutter: Secondary | ICD-10-CM | POA: Insufficient documentation

## 2022-01-25 DIAGNOSIS — R002 Palpitations: Secondary | ICD-10-CM

## 2022-02-04 ENCOUNTER — Encounter (INDEPENDENT_AMBULATORY_CARE_PROVIDER_SITE_OTHER): Payer: Self-pay

## 2022-02-09 ENCOUNTER — Encounter: Payer: Self-pay | Admitting: Cardiovascular Disease

## 2022-02-09 NOTE — Progress Notes (Unsigned)
Cardiology Office Note:    Date:  02/10/2022   ID:  Casey Serrano, DOB March 28, 1966, MRN [id number]  PCP:  Etta Grandchild, MD   [facility] Providers Cardiologist:  Koden Hunzeker       Referring MD: Etta Grandchild, MD   Chief Complaint  Patient presents with   Atrial Fibrillation         History of Present Illness:    Casey Serrano is a 56 y.o. female with a hx of hx of atrial flutter  Hx of HTN Hx of palitations  Event monitor showed atrial fib  She had an allergic reaction to the adhesive on the monitor so she  only able to wear it for 1 day.  Fortunately it showed atrial fibrillation early that morning.  Has had palpitations - she thought it was due to the onset of menopause.  Does not have OSA from what she can tell any headaches She knows that she does not sleep well   Also stress of her father passing away several weeks  Works in the surgical center of [employer]  Previously worked as a Occupational psychologist in [place]    Used to exercise , not as much over the past year or so   CHADS2 VASC is 2 (female, HTN) Is also prediabetic   Lipids are minimally   Coronary calcium score is 0 on September 26, 2020.  Past Medical History:  Diagnosis Date   Anemia    Anxiety    Back pain    Breast discharge    Depression    Fatigue    GERD (gastroesophageal reflux disease)    Hypertension    SOB (shortness of breath) on exertion     Past Surgical History:  Procedure Laterality Date   BREAST BIOPSY Left    benign   BREAST REDUCTION SURGERY     CARPAL TUNNEL RELEASE Bilateral 2008   ENDOMETRIAL ABLATION  2004   REDUCTION MAMMAPLASTY Bilateral    TONSILLECTOMY  2004    Current Medications: Current Meds  Medication Sig   ALPRAZolam (XANAX) 0.5 MG tablet Take 0.5 mg by mouth 3 (three) times daily as needed.   amoxicillin (AMOXIL) 500 MG capsule Take 500 mg by mouth 3 (three) times daily.   Calcium Carbonate+Vitamin D 600-200 MG-UNIT TABS Take 1 tablet by mouth  daily.   escitalopram (LEXAPRO) 10 MG tablet Take 10 mg by mouth daily.   estradiol (ESTRACE) 1 MG tablet Take 1 mg by mouth daily.   famotidine (PEPCID) 20 MG tablet TAKE 1 TABLET BY MOUTH EVERYDAY AT BEDTIME PRN   ibuprofen (ADVIL) 200 MG tablet Take 200 mg by mouth every 6 (six) hours as needed.   Krill Oil 500 MG CAPS Take 1 capsule by mouth daily.   meloxicam (MOBIC) 15 MG tablet Take 15 mg by mouth daily. Take 15 mg by mouth daily PRN   Multiple Vitamins-Minerals (HAIR SKIN AND NAILS FORMULA) TABS Take 1 tablet by mouth daily.   olmesartan (BENICAR) 20 MG tablet Take 1 tablet (20 mg total) by mouth daily.   pantoprazole (PROTONIX) 40 MG tablet Take 1 tablet (40 mg total) by mouth daily.   progesterone (PROMETRIUM) 100 MG capsule progesterone micronized 100 mg capsule  TAKE 1 CAPSULE BY MOUTH EVERY DAY   spironolactone (ALDACTONE) 25 MG tablet Take 1 tablet (25 mg total) by mouth daily.   valACYclovir (VALTREX) 1000 MG tablet Take 1,000 mg by mouth daily as needed.   Vitamin D,  Ergocalciferol, (DRISDOL) 1.25 MG (50000 UNIT) CAPS capsule Take 1 capsule (50,000 Units total) by mouth every 7 (seven) days.   zolpidem (AMBIEN) 10 MG tablet zolpidem 10 mg tablet  TAKE 1 TABLET BY MOUTH EVERY DAY     Allergies:   Patient has no known allergies.   Social History   Socioeconomic History   Marital status: Married    Spouse name: Glendell Docker   Number of children: Not on file   Years of education: Not on file   Highest education level: Not on file  Occupational History   Occupation: Nurse  Tobacco Use   Smoking status: Never    Passive exposure: Past   Smokeless tobacco: Never  Vaping Use   Vaping Use: Never used  Substance and Sexual Activity   Alcohol use: Yes    Alcohol/week: 10.0 standard drinks of alcohol    Types: 10 Glasses of wine per week   Drug use: Never   Sexual activity: Yes    Partners: Male  Other Topics Concern   Not on file  Social History Narrative   Not on file    Social Determinants of Health   Financial Resource Strain: Not on file  Food Insecurity: Not on file  Transportation Needs: Not on file  Physical Activity: Not on file  Stress: Not on file  Social Connections: Not on file     Family History: The patient's family history includes Cancer in her maternal grandmother, mother, and paternal uncle; Diabetes in her father and sister; Emphysema in her maternal grandfather, maternal grandmother, and mother; Heart disease in her father, paternal grandfather, paternal grandmother, and paternal uncle; Hyperlipidemia in her father; Hypertension in her father and mother; Kidney disease in her father; Stroke in her father.  ROS:   Please see the history of present illness.     All other systems reviewed and are negative.  EKGs/Labs/Other Studies Reviewed:    The following studies were reviewed today:   EKG: 12/25/21  NSR   Event monitor:   PAF   Recent Labs: 02/20/2021: ALT 13; Hemoglobin 13.4; Platelets 413 12/25/2021: BUN 12; Creatinine, Ser 0.86; Potassium 3.4; Sodium 138; TSH 3.46  Recent Lipid Panel    Component Value Date/Time   CHOL 233 (A) 11/19/2021 0000   CHOL 213 (H) 02/20/2021 1032   TRIG 79 11/19/2021 0000   HDL 71 (A) 11/19/2021 0000   HDL 57 02/20/2021 1032   CHOLHDL 3.7 02/20/2021 1032   LDLCALC 144 11/19/2021 0000   LDLCALC 136 (H) 02/20/2021 1032     Risk Assessment/Calculations:    CHA2DS2-VASc Score = 2   This indicates a 2.2% annual risk of stroke. The patient's score is based upon: CHF History: 0 HTN History: 1 Diabetes History: 0 Stroke History: 0 Vascular Disease History: 0 Age Score: 0 Gender Score: 1           Physical Exam:    VS:  BP 118/80   Pulse 73   Ht 5\' 3"  (1.6 m)   Wt 181 lb 6.4 oz (82.3 kg)   SpO2 97%   BMI 32.13 kg/m     Wt Readings from Last 3 Encounters:  02/10/22 181 lb 6.4 oz (82.3 kg)  12/25/21 182 lb (82.6 kg)  10/29/21 183 lb (83 kg)     GEN:  Well nourished,  well developed in no acute distress HEENT: Normal NECK: No JVD; No carotid bruits LYMPHATICS: No lymphadenopathy CARDIAC: RRR, no murmurs, rubs, gallops RESPIRATORY:  Clear to auscultation  without rales, wheezing or rhonchi  ABDOMEN: Soft, non-tender, non-distended MUSCULOSKELETAL:  No edema; No deformity  SKIN: Warm and dry NEUROLOGIC:  Alert and oriented x 3 PSYCHIATRIC:  Normal affect   ASSESSMENT:    1. PAF (paroxysmal atrial fibrillation) (HCC)   2. Primary hypertension    PLAN:    In order of problems listed above:  Paroxysmal atrial fibrillation: Tressa presents for further evaluation of very brief episodes of paroxysmal atrial fibrillation.  She has had palpitations for years that she thought were due to menopause.  Her episodes seem to only last for a few seconds.  Her CHA2DS2-VASc score is 2. We discussed starting Eliquis but I think that she would be okay to wait until her A-fib gets worse.    Discussed the fact that she We will get another point when she turns 65.            Medication Adjustments/Labs and Tests Ordered: Current medicines are reviewed at length with the patient today.  Concerns regarding medicines are outlined above.  No orders of the defined types were placed in this encounter.  No orders of the defined types were placed in this encounter.   Patient Instructions  Medication Instructions:  Your physician recommends that you continue on your current medications as directed. Please refer to the Current Medication list given to you today.  *If you need a refill on your cardiac medications before your next appointment, please call your pharmacy*   Lab Work: NONE If you have labs (blood work) drawn today and your tests are completely normal, you will receive your results only by: MyChart Message (if you have MyChart) OR A paper copy in the mail If you have any lab test that is abnormal or we need to change your treatment, we will call you to  review the results.   Testing/Procedures: NONE   Follow-Up: At [facility], you and your health needs are our priority.  As part of our continuing mission to provide you with exceptional heart care, we have created designated Provider Care Teams.  These Care Teams include your primary Cardiologist (physician) and Advanced Practice Providers (APPs -  Physician Assistants and Nurse Practitioners) who all work together to provide you with the care you need, when you need it.  We recommend signing up for the patient portal called "MyChart".  Sign up information is provided on this After Visit Summary.  MyChart is used to connect with patients for Virtual Visits (Telemedicine).  Patients are able to view lab/test results, encounter notes, upcoming appointments, etc.  Non-urgent messages can be sent to your provider as well.   To learn more about what you can do with MyChart, go to [email or web address].    Your next appointment:   1 year(s)  The format for your next appointment:   In Person  Provider:  Kristeen Miss, MD     Important Information About Sugar         Signed, Kristeen Miss, MD  02/10/2022 5:58 PM    [facility]

## 2022-02-10 ENCOUNTER — Encounter: Payer: Self-pay | Admitting: Cardiovascular Disease

## 2022-02-10 ENCOUNTER — Ambulatory Visit (INDEPENDENT_AMBULATORY_CARE_PROVIDER_SITE_OTHER): Payer: BC Managed Care – PPO | Admitting: Cardiovascular Disease

## 2022-02-10 VITALS — BP 118/80 | HR 73 | Ht 63.0 in | Wt 181.4 lb

## 2022-02-10 DIAGNOSIS — I10 Essential (primary) hypertension: Secondary | ICD-10-CM

## 2022-02-10 DIAGNOSIS — I480 Paroxysmal atrial fibrillation: Secondary | ICD-10-CM

## 2022-02-10 NOTE — Patient Instructions (Signed)

## 2022-02-18 ENCOUNTER — Other Ambulatory Visit: Payer: Self-pay | Admitting: Internal Medicine

## 2022-02-18 DIAGNOSIS — I10 Essential (primary) hypertension: Secondary | ICD-10-CM

## 2022-02-18 DIAGNOSIS — E269 Hyperaldosteronism, unspecified: Secondary | ICD-10-CM

## 2022-03-04 ENCOUNTER — Other Ambulatory Visit: Payer: Self-pay | Admitting: Internal Medicine

## 2022-03-04 ENCOUNTER — Encounter: Payer: Self-pay | Admitting: Internal Medicine

## 2022-03-04 DIAGNOSIS — F5104 Psychophysiologic insomnia: Secondary | ICD-10-CM

## 2022-03-04 MED ORDER — QUVIVIQ 25 MG PO TABS: 1.0000 | ORAL_TABLET | Freq: Every day | ORAL | 5 refills | Status: DC

## 2022-03-04 NOTE — Telephone Encounter (Signed)
Patient came by and picked up 

## 2022-03-30 ENCOUNTER — Ambulatory Visit (HOSPITAL_COMMUNITY): Payer: BC Managed Care – PPO

## 2022-04-23 ENCOUNTER — Other Ambulatory Visit: Payer: Self-pay | Admitting: Internal Medicine

## 2022-04-23 DIAGNOSIS — E269 Hyperaldosteronism, unspecified: Secondary | ICD-10-CM

## 2022-04-23 DIAGNOSIS — I10 Essential (primary) hypertension: Secondary | ICD-10-CM

## 2022-05-03 ENCOUNTER — Other Ambulatory Visit: Payer: Self-pay | Admitting: Internal Medicine

## 2022-05-03 DIAGNOSIS — K219 Gastro-esophageal reflux disease without esophagitis: Secondary | ICD-10-CM

## 2022-05-04 ENCOUNTER — Ambulatory Visit (INDEPENDENT_AMBULATORY_CARE_PROVIDER_SITE_OTHER): Payer: BC Managed Care – PPO | Admitting: Internal Medicine

## 2022-05-04 ENCOUNTER — Encounter: Payer: Self-pay | Admitting: Internal Medicine

## 2022-05-04 VITALS — BP 146/88 | HR 80 | Temp 97.9°F | Ht 63.0 in | Wt 182.0 lb

## 2022-05-04 DIAGNOSIS — D538 Other specified nutritional anemias: Secondary | ICD-10-CM | POA: Diagnosis not present

## 2022-05-04 DIAGNOSIS — E269 Hyperaldosteronism, unspecified: Secondary | ICD-10-CM

## 2022-05-04 DIAGNOSIS — D539 Nutritional anemia, unspecified: Secondary | ICD-10-CM

## 2022-05-04 DIAGNOSIS — R7303 Prediabetes: Secondary | ICD-10-CM | POA: Diagnosis not present

## 2022-05-04 DIAGNOSIS — I10 Essential (primary) hypertension: Secondary | ICD-10-CM | POA: Diagnosis not present

## 2022-05-04 DIAGNOSIS — E118 Type 2 diabetes mellitus with unspecified complications: Secondary | ICD-10-CM

## 2022-05-04 MED ORDER — RYBELSUS 3 MG PO TABS: 3.0000 mg | ORAL_TABLET | Freq: Every day | ORAL | 0 refills | Status: DC

## 2022-05-04 NOTE — Progress Notes (Signed)
Subjective:  Patient ID: Casey Serrano, female    DOB: [birth year]  Age: 56 y.o. MRN: [id number]  CC: Hypertension and Anemia   HPI Casey Serrano presents for f/up -   She walks about a mile a day and has good endurance.  She denies chest pain, shortness of breath, diaphoresis, or edema.  She complains that her blood pressure is not adequately well controlled.   Outpatient Medications Prior to Visit  Medication Sig Dispense Refill   ALPRAZolam (XANAX) 0.5 MG tablet Take 0.5 mg by mouth 3 (three) times daily as needed.     Calcium Carbonate+Vitamin D 600-200 MG-UNIT TABS Take 1 tablet by mouth daily.     escitalopram (LEXAPRO) 10 MG tablet Take 10 mg by mouth daily.     estradiol (ESTRACE) 1 MG tablet Take 1 mg by mouth daily.     famotidine (PEPCID) 20 MG tablet TAKE 1 TABLET BY MOUTH EVERYDAY AT BEDTIME PRN     ibuprofen (ADVIL) 200 MG tablet Take 200 mg by mouth every 6 (six) hours as needed.     Krill Oil 500 MG CAPS Take 1 capsule by mouth daily.     Multiple Vitamins-Minerals (HAIR SKIN AND NAILS FORMULA) TABS Take 1 tablet by mouth daily.     pantoprazole (PROTONIX) 40 MG tablet TAKE 1 TABLET BY MOUTH EVERY DAY 90 tablet 1   progesterone (PROMETRIUM) 100 MG capsule progesterone micronized 100 mg capsule  TAKE 1 CAPSULE BY MOUTH EVERY DAY     spironolactone (ALDACTONE) 25 MG tablet TAKE 1 TABLET (25 MG TOTAL) BY MOUTH DAILY. 90 tablet 0   valACYclovir (VALTREX) 1000 MG tablet Take 1,000 mg by mouth daily as needed.     Vitamin D, Ergocalciferol, (DRISDOL) 1.25 MG (50000 UNIT) CAPS capsule Take 1 capsule (50,000 Units total) by mouth every 7 (seven) days. 4 capsule 0   amoxicillin (AMOXIL) 500 MG capsule Take 500 mg by mouth 3 (three) times daily.     Daridorexant HCl (QUVIVIQ) 25 MG TABS Take 1 tablet by mouth at bedtime. 30 tablet 5   meloxicam (MOBIC) 15 MG tablet Take 15 mg by mouth daily. Take 15 mg by mouth daily PRN     olmesartan (BENICAR) 20 MG tablet Take 1 tablet (20  mg total) by mouth daily. 90 tablet 1   No facility-administered medications prior to visit.    ROS Review of Systems  Constitutional:  Negative for chills, diaphoresis, fatigue and fever.  HENT: Negative.    Eyes: Negative.   Respiratory:  Negative for cough, chest tightness, shortness of breath and wheezing.   Cardiovascular:  Negative for chest pain, palpitations and leg swelling.  Gastrointestinal:  Negative for abdominal pain, constipation, diarrhea, nausea and vomiting.  Endocrine: Negative.   Genitourinary: Negative.  Negative for difficulty urinating.  Musculoskeletal: Negative.  Negative for arthralgias, back pain and myalgias.  Skin: Negative.   Neurological: Negative.  Negative for dizziness and weakness.  Hematological:  Negative for adenopathy. Does not bruise/bleed easily.  Psychiatric/Behavioral:  Positive for sleep disturbance. Negative for confusion, decreased concentration and dysphoric mood. The patient is nervous/anxious.     Objective:  BP (!) 146/88 (BP Location: Right Arm, Patient Position: Sitting, Cuff Size: Large)   Pulse 80   Temp 97.9 F (36.6 C) (Oral)   Ht 5\' 3"  (1.6 m)   Wt 182 lb (82.6 kg)   SpO2 96%   BMI 32.24 kg/m   BP Readings from Last 3 Encounters:  05/04/22 13/06/23)  146/88  02/10/22 118/80  12/25/21 (!) 146/82    Wt Readings from Last 3 Encounters:  05/04/22 182 lb (82.6 kg)  02/10/22 181 lb 6.4 oz (82.3 kg)  12/25/21 182 lb (82.6 kg)    Physical Exam Vitals reviewed.  HENT:     Nose: Nose normal.     Mouth/Throat:     Mouth: Mucous membranes are moist.  Eyes:     General: No scleral icterus.    Conjunctiva/sclera: Conjunctivae normal.  Cardiovascular:     Rate and Rhythm: Normal rate and regular rhythm.     Heart sounds: No murmur heard. Pulmonary:     Effort: Pulmonary effort is normal.     Breath sounds: No stridor. No wheezing, rhonchi or rales.  Abdominal:     General: Abdomen is flat.     Palpations: There is no  mass.     Tenderness: There is no abdominal tenderness. There is no guarding.     Hernia: No hernia is present.  Musculoskeletal:        General: Normal range of motion.     Cervical back: Neck supple.     Right lower leg: No edema.     Left lower leg: No edema.  Lymphadenopathy:     Cervical: No cervical adenopathy.  Skin:    General: Skin is warm and dry.  Neurological:     General: No focal deficit present.     Mental Status: She is alert.  Psychiatric:        Mood and Affect: Mood normal.        Behavior: Behavior normal.     Lab Results  Component Value Date   WBC 8.2 05/04/2022   HGB 11.8 (L) 05/04/2022   HCT 35.7 (L) 05/04/2022   PLT 389.0 05/04/2022   GLUCOSE 90 05/04/2022   CHOL 233 (A) 11/19/2021   TRIG 79 11/19/2021   HDL 71 (A) 11/19/2021   LDLCALC 144 11/19/2021   ALT 14 05/04/2022   AST 14 05/04/2022   NA 138 05/04/2022   K 3.8 05/04/2022   CL 105 05/04/2022   CREATININE 0.83 05/04/2022   BUN 16 05/04/2022   CO2 26 05/04/2022   TSH 3.46 12/25/2021   HGBA1C 6.3 05/04/2022    MM 3D SCREEN BREAST BILATERAL  Result Date: 08/19/2021 CLINICAL DATA:  Screening. EXAM: DIGITAL SCREENING BILATERAL MAMMOGRAM WITH TOMOSYNTHESIS AND CAD TECHNIQUE: Bilateral screening digital craniocaudal and mediolateral oblique mammograms were obtained. Bilateral screening digital breast tomosynthesis was performed. The images were evaluated with computer-aided detection. COMPARISON:  Previous exam(s). ACR Breast Density Category b: There are scattered areas of fibroglandular density. FINDINGS: There are no findings suspicious for malignancy. IMPRESSION: No mammographic evidence of malignancy. A result letter of this screening mammogram will be mailed directly to the patient. RECOMMENDATION: Screening mammogram in one year. (Code:SM-B-01Y) BI-RADS CATEGORY  1: Negative. Electronically Signed   By: Dorise Bullion III M.D.   On: 08/19/2021 19:38    Assessment & Plan:   Ladaja was  seen today for hypertension and anemia.  Diagnoses and all orders for this visit:  Primary hypertension- Her blood pressure is not adequately well controlled.  Will increase the dose of olmesartan. -     Basic metabolic panel; Future -     CBC with Differential/Platelet; Future -     Hepatic function panel; Future -     Hepatic function panel -     CBC with Differential/Platelet -     Basic metabolic  panel -     olmesartan (BENICAR) 40 MG tablet; Take 1 tablet (40 mg total) by mouth daily.  Hyperaldosteronism (HCC) -     Basic metabolic panel; Future -     Hepatic function panel; Future -     Hepatic function panel -     Basic metabolic panel  Prediabetes -     Basic metabolic panel; Future -     Hepatic function panel; Future -     Hemoglobin A1c; Future -     Hemoglobin A1c -     Hepatic function panel -     Basic metabolic panel  Type II diabetes mellitus with manifestations (HCC) -     Semaglutide (RYBELSUS) 3 MG TABS; Take 3 mg by mouth daily.  Deficiency anemia -     IBC + Ferritin; Future -     Reticulocytes; Future -     Folate; Future -     Zinc; Future -     Vitamin B12; Future  Anemia due to zinc deficiency -     zinc gluconate 50 MG tablet; Take 1 tablet (50 mg total) by mouth daily.   I have discontinued Olegario Messier L. Morawski's olmesartan, amoxicillin, meloxicam, and Quviviq. I am also having her start on Rybelsus, olmesartan, and zinc gluconate. Additionally, I am having her maintain her Calcium Carbonate+Vitamin D, escitalopram, progesterone, valACYclovir, ALPRAZolam, Krill Oil, Hair Skin and Nails Formula, ibuprofen, Vitamin D (Ergocalciferol), estradiol, famotidine, spironolactone, and pantoprazole.  Meds ordered this encounter  Medications   Semaglutide (RYBELSUS) 3 MG TABS    Sig: Take 3 mg by mouth daily.    Dispense:  30 tablet    Refill:  0   olmesartan (BENICAR) 40 MG tablet    Sig: Take 1 tablet (40 mg total) by mouth daily.    Dispense:  90 tablet     Refill:  0   zinc gluconate 50 MG tablet    Sig: Take 1 tablet (50 mg total) by mouth daily.    Dispense:  90 tablet    Refill:  1     Follow-up: Return in about 6 months (around 11/02/2022).  Sanda Linger, MD

## 2022-05-04 NOTE — Patient Instructions (Signed)
Hypertension, Adult High blood pressure (hypertension) is when the force of blood pumping through the arteries is too strong. The arteries are the blood vessels that carry blood from the heart throughout the body. Hypertension forces the heart to work harder to pump blood and may cause arteries to become narrow or stiff. Untreated or uncontrolled hypertension can lead to a heart attack, heart failure, a stroke, kidney disease, and other problems. A blood pressure reading consists of a higher number over a lower number. Ideally, your blood pressure should be below 120/80. The first ("top") number is called the systolic pressure. It is a measure of the pressure in your arteries as your heart beats. The second ("bottom") number is called the diastolic pressure. It is a measure of the pressure in your arteries as the heart relaxes. What are the causes? The exact cause of this condition is not known. There are some conditions that result in high blood pressure. What increases the risk? Certain factors may make you more likely to develop high blood pressure. Some of these risk factors are under your control, including: Smoking. Not getting enough exercise or physical activity. Being overweight. Having too much fat, sugar, calories, or salt (sodium) in your diet. Drinking too much alcohol. Other risk factors include: Having a personal history of heart disease, diabetes, high cholesterol, or kidney disease. Stress. Having a family history of high blood pressure and high cholesterol. Having obstructive sleep apnea. Age. The risk increases with age. What are the signs or symptoms? High blood pressure may not cause symptoms. Very high blood pressure (hypertensive crisis) may cause: Headache. Fast or irregular heartbeats (palpitations). Shortness of breath. Nosebleed. Nausea and vomiting. Vision changes. Severe chest pain, dizziness, and seizures. How is this diagnosed? This condition is diagnosed by  measuring your blood pressure while you are seated, with your arm resting on a flat surface, your legs uncrossed, and your feet flat on the floor. The cuff of the blood pressure monitor will be placed directly against the skin of your upper arm at the level of your heart. Blood pressure should be measured at least twice using the same arm. Certain conditions can cause a difference in blood pressure between your right and left arms. If you have a high blood pressure reading during one visit or you have normal blood pressure with other risk factors, you may be asked to: Return on a different day to have your blood pressure checked again. Monitor your blood pressure at home for 1 week or longer. If you are diagnosed with hypertension, you may have other blood or imaging tests to help your health care provider understand your overall risk for other conditions. How is this treated? This condition is treated by making healthy lifestyle changes, such as eating healthy foods, exercising more, and reducing your alcohol intake. You may be referred for counseling on a healthy diet and physical activity. Your health care provider may prescribe medicine if lifestyle changes are not enough to get your blood pressure under control and if: Your systolic blood pressure is above 130. Your diastolic blood pressure is above 80. Your personal target blood pressure may vary depending on your medical conditions, your age, and other factors. Follow these instructions at home: Eating and drinking  Eat a diet that is high in fiber and potassium, and low in sodium, added sugar, and fat. An example of this eating plan is called the DASH diet. DASH stands for Dietary Approaches to Stop Hypertension. To eat this way: Eat   plenty of fresh fruits and vegetables. Try to fill one half of your plate at each meal with fruits and vegetables. Eat whole grains, such as whole-wheat pasta, brown rice, or whole-grain bread. Fill about one  fourth of your plate with whole grains. Eat or drink low-fat dairy products, such as skim milk or low-fat yogurt. Avoid fatty cuts of meat, processed or cured meats, and poultry with skin. Fill about one fourth of your plate with lean proteins, such as fish, chicken without skin, beans, eggs, or tofu. Avoid pre-made and processed foods. These tend to be higher in sodium, added sugar, and fat. Reduce your daily sodium intake. Many people with hypertension should eat less than 1,500 mg of sodium a day. Do not drink alcohol if: Your health care provider tells you not to drink. You are pregnant, may be pregnant, or are planning to become pregnant. If you drink alcohol: Limit how much you have to: 0-1 drink a day for women. 0-2 drinks a day for men. Know how much alcohol is in your drink. In the [place] one drink equals one 12 oz bottle of beer (355 mL), one 5 oz glass of wine (148 mL), or one 1 oz glass of hard liquor (44 mL). Lifestyle  Work with your health care provider to maintain a healthy body weight or to lose weight. Ask what an ideal weight is for you. Get at least 30 minutes of exercise that causes your heart to beat faster (aerobic exercise) most days of the week. Activities may include walking, swimming, or biking. Include exercise to strengthen your muscles (resistance exercise), such as Pilates or lifting weights, as part of your weekly exercise routine. Try to do these types of exercises for 30 minutes at least 3 days a week. Do not use any products that contain nicotine or tobacco. These products include cigarettes, chewing tobacco, and vaping devices, such as e-cigarettes. If you need help quitting, ask your health care provider. Monitor your blood pressure at home as told by your health care provider. Keep all follow-up visits. This is important. Medicines Take over-the-counter and prescription medicines only as told by your health care provider. Follow directions carefully. Blood  pressure medicines must be taken as prescribed. Do not skip doses of blood pressure medicine. Doing this puts you at risk for problems and can make the medicine less effective. Ask your health care provider about side effects or reactions to medicines that you should watch for. Contact a health care provider if you: Think you are having a reaction to a medicine you are taking. Have headaches that keep coming back (recurring). Feel dizzy. Have swelling in your ankles. Have trouble with your vision. Get help right away if you: Develop a severe headache or confusion. Have unusual weakness or numbness. Feel faint. Have severe pain in your chest or abdomen. Vomit repeatedly. Have trouble breathing. These symptoms may be an emergency. Get help right away. Call 911. Do not wait to see if the symptoms will go away. Do not drive yourself to the hospital. Summary Hypertension is when the force of blood pumping through your arteries is too strong. If this condition is not controlled, it may put you at risk for serious complications. Your personal target blood pressure may vary depending on your medical conditions, your age, and other factors. For most people, a normal blood pressure is less than 120/80. Hypertension is treated with lifestyle changes, medicines, or a combination of both. Lifestyle changes include losing weight, eating a healthy,   low-sodium diet, exercising more, and limiting alcohol. This information is not intended to replace advice given to you by your health care provider. Make sure you discuss any questions you have with your health care provider. Document Revised: 04/22/2021 Document Reviewed: 04/22/2021 Elsevier Patient Education  2023 [employer]vier Inc.  

## 2022-05-05 ENCOUNTER — Other Ambulatory Visit (INDEPENDENT_AMBULATORY_CARE_PROVIDER_SITE_OTHER): Payer: BC Managed Care – PPO

## 2022-05-05 DIAGNOSIS — D539 Nutritional anemia, unspecified: Secondary | ICD-10-CM | POA: Insufficient documentation

## 2022-05-05 LAB — CBC WITH DIFFERENTIAL/PLATELET
Basophils Absolute: 0.1 10*3/uL (ref 0.0–0.1)
Basophils Relative: 1.1 % (ref 0.0–3.0)
Eosinophils Absolute: 0.1 10*3/uL (ref 0.0–0.7)
Eosinophils Relative: 1.8 % (ref 0.0–5.0)
HCT: 35.7 % — ABNORMAL LOW (ref 36.0–46.0)
Hemoglobin: 11.8 g/dL — ABNORMAL LOW (ref 12.0–15.0)
Lymphocytes Relative: 33.6 % (ref 12.0–46.0)
Lymphs Abs: 2.7 10*3/uL (ref 0.7–4.0)
MCHC: 33 g/dL (ref 30.0–36.0)
MCV: 89.4 fl (ref 78.0–100.0)
Monocytes Absolute: 0.6 10*3/uL (ref 0.1–1.0)
Monocytes Relative: 6.9 % (ref 3.0–12.0)
Neutro Abs: 4.6 10*3/uL (ref 1.4–7.7)
Neutrophils Relative %: 56.6 % (ref 43.0–77.0)
Platelets: 389 10*3/uL (ref 150.0–400.0)
RBC: 3.99 Mil/uL (ref 3.87–5.11)
RDW: 13.2 % (ref 11.5–15.5)
WBC: 8.2 10*3/uL (ref 4.0–10.5)

## 2022-05-05 LAB — HEPATIC FUNCTION PANEL
ALT: 14 U/L (ref 0–35)
AST: 14 U/L (ref 0–37)
Albumin: 4 g/dL (ref 3.5–5.2)
Alkaline Phosphatase: 46 U/L (ref 39–117)
Bilirubin, Direct: 0.1 mg/dL (ref 0.0–0.3)
Total Bilirubin: 0.3 mg/dL (ref 0.2–1.2)
Total Protein: 6.9 g/dL (ref 6.0–8.3)

## 2022-05-05 LAB — BASIC METABOLIC PANEL
BUN: 16 mg/dL (ref 6–23)
CO2: 26 mEq/L (ref 19–32)
Calcium: 8.8 mg/dL (ref 8.4–10.5)
Chloride: 105 mEq/L (ref 96–112)
Creatinine, Ser: 0.83 mg/dL (ref 0.40–1.20)
GFR: 78.75 mL/min (ref >60.00)
Glucose, Bld: 90 mg/dL (ref 70–99)
Potassium: 3.8 mEq/L (ref 3.5–5.1)
Sodium: 138 mEq/L (ref 135–145)

## 2022-05-05 LAB — IBC + FERRITIN
Ferritin: 42.5 ng/mL (ref 10.0–291.0)
Iron: 68 ug/dL (ref 42–145)
Saturation Ratios: 15.8 % — ABNORMAL LOW (ref 20.0–50.0)
TIBC: 429.8 ug/dL (ref 250.0–450.0)
Transferrin: 307 mg/dL (ref 212.0–360.0)

## 2022-05-05 LAB — FOLATE: Folate: 19.2 ng/mL (ref >5.9)

## 2022-05-05 LAB — VITAMIN B12: Vitamin B-12: 288 pg/mL (ref 211–911)

## 2022-05-05 LAB — HEMOGLOBIN A1C: Hgb A1c MFr Bld: 6.3 % (ref 4.6–6.5)

## 2022-05-05 MED ORDER — OLMESARTAN MEDOXOMIL 40 MG PO TABS: 40.0000 mg | ORAL_TABLET | Freq: Every day | ORAL | 0 refills | Status: DC

## 2022-05-07 ENCOUNTER — Encounter: Payer: Self-pay | Admitting: Internal Medicine

## 2022-05-07 DIAGNOSIS — D538 Other specified nutritional anemias: Secondary | ICD-10-CM | POA: Insufficient documentation

## 2022-05-07 LAB — RETICULOCYTES
ABS Retic: 61350 cells/uL (ref 20000–80000)
Retic Ct Pct: 1.5 %

## 2022-05-07 LAB — ZINC: Zinc: 54 ug/dL — ABNORMAL LOW (ref 60–130)

## 2022-05-07 MED ORDER — ZINC GLUCONATE 50 MG PO TABS: 50.0000 mg | ORAL_TABLET | Freq: Every day | ORAL | 1 refills | Status: DC

## 2022-06-01 ENCOUNTER — Encounter: Payer: Self-pay | Admitting: Internal Medicine

## 2022-06-01 ENCOUNTER — Other Ambulatory Visit: Payer: Self-pay | Admitting: Internal Medicine

## 2022-06-01 DIAGNOSIS — E118 Type 2 diabetes mellitus with unspecified complications: Secondary | ICD-10-CM

## 2022-06-01 MED ORDER — RYBELSUS 7 MG PO TABS: 7.0000 mg | ORAL_TABLET | Freq: Every day | ORAL | 0 refills | Status: DC

## 2022-06-03 ENCOUNTER — Telehealth: Payer: Self-pay

## 2022-06-03 NOTE — Telephone Encounter (Signed)
Key: [id number]

## 2022-06-04 NOTE — Telephone Encounter (Signed)
PA deined-  Reason given: You do not meet the clinical requirements for this medication.

## 2022-06-18 ENCOUNTER — Encounter: Payer: Self-pay | Admitting: Internal Medicine

## 2022-06-19 ENCOUNTER — Ambulatory Visit (INDEPENDENT_AMBULATORY_CARE_PROVIDER_SITE_OTHER): Payer: BC Managed Care – PPO | Admitting: Internal Medicine

## 2022-06-19 ENCOUNTER — Ambulatory Visit (INDEPENDENT_AMBULATORY_CARE_PROVIDER_SITE_OTHER): Payer: BC Managed Care – PPO

## 2022-06-19 ENCOUNTER — Encounter: Payer: Self-pay | Admitting: Internal Medicine

## 2022-06-19 VITALS — BP 124/78 | HR 78 | Temp 98.0°F | Resp 16 | Ht 63.0 in | Wt 178.0 lb

## 2022-06-19 DIAGNOSIS — R911 Solitary pulmonary nodule: Secondary | ICD-10-CM

## 2022-06-19 DIAGNOSIS — D538 Other specified nutritional anemias: Secondary | ICD-10-CM

## 2022-06-19 DIAGNOSIS — R052 Subacute cough: Secondary | ICD-10-CM | POA: Diagnosis not present

## 2022-06-19 DIAGNOSIS — R059 Cough, unspecified: Secondary | ICD-10-CM | POA: Diagnosis not present

## 2022-06-19 DIAGNOSIS — I10 Essential (primary) hypertension: Secondary | ICD-10-CM

## 2022-06-19 DIAGNOSIS — Z20828 Contact with and (suspected) exposure to other viral communicable diseases: Secondary | ICD-10-CM | POA: Diagnosis not present

## 2022-06-19 DIAGNOSIS — J22 Unspecified acute lower respiratory infection: Secondary | ICD-10-CM

## 2022-06-19 MED ORDER — OSELTAMIVIR PHOSPHATE 75 MG PO CAPS: 75.0000 mg | ORAL_CAPSULE | Freq: Every day | ORAL | 0 refills | Status: AC

## 2022-06-19 MED ORDER — HYDROCOD POLI-CHLORPHE POLI ER 10-8 MG/5ML PO SUER: 5.0000 mL | Freq: Two times a day (BID) | ORAL | 0 refills | Status: DC | PRN

## 2022-06-19 MED ORDER — AMOXICILLIN-POT CLAVULANATE 875-125 MG PO TABS: 1.0000 | ORAL_TABLET | Freq: Two times a day (BID) | ORAL | 0 refills | Status: AC

## 2022-06-19 NOTE — Patient Instructions (Signed)

## 2022-06-19 NOTE — Progress Notes (Signed)
Subjective:  Patient ID: Casey Serrano, female    DOB: 05/15/1966  Age: 56 y.o. MRN: [id number]  CC: URI   HPI MIRAY MANCINO presents for f/up -  She complains of a 3-week history of URI symptoms with postnasal drip, cough productive of yellow phlegm, and she was exposed to influenza 2 days ago at work.  Outpatient Medications Prior to Visit  Medication Sig Dispense Refill   ALPRAZolam (XANAX) 0.5 MG tablet Take 0.5 mg by mouth 3 (three) times daily as needed.     Calcium Carbonate+Vitamin D 600-200 MG-UNIT TABS Take 1 tablet by mouth daily.     escitalopram (LEXAPRO) 10 MG tablet Take 10 mg by mouth daily.     estradiol (ESTRACE) 1 MG tablet Take 1 mg by mouth daily.     famotidine (PEPCID) 20 MG tablet TAKE 1 TABLET BY MOUTH EVERYDAY AT BEDTIME PRN     ibuprofen (ADVIL) 200 MG tablet Take 200 mg by mouth every 6 (six) hours as needed.     Krill Oil 500 MG CAPS Take 1 capsule by mouth daily.     Multiple Vitamins-Minerals (HAIR SKIN AND NAILS FORMULA) TABS Take 1 tablet by mouth daily.     olmesartan (BENICAR) 40 MG tablet Take 1 tablet (40 mg total) by mouth daily. 90 tablet 0   pantoprazole (PROTONIX) 40 MG tablet TAKE 1 TABLET BY MOUTH EVERY DAY 90 tablet 1   progesterone (PROMETRIUM) 100 MG capsule progesterone micronized 100 mg capsule  TAKE 1 CAPSULE BY MOUTH EVERY DAY     Semaglutide (RYBELSUS) 7 MG TABS Take 7 mg by mouth daily. 90 tablet 0   spironolactone (ALDACTONE) 25 MG tablet TAKE 1 TABLET (25 MG TOTAL) BY MOUTH DAILY. 90 tablet 0   valACYclovir (VALTREX) 1000 MG tablet Take 1,000 mg by mouth daily as needed.     Vitamin D, Ergocalciferol, (DRISDOL) 1.25 MG (50000 UNIT) CAPS capsule Take 1 capsule (50,000 Units total) by mouth every 7 (seven) days. 4 capsule 0   zinc gluconate 50 MG tablet Take 1 tablet (50 mg total) by mouth daily. 90 tablet 1   No facility-administered medications prior to visit.    ROS Review of Systems  Constitutional:  Negative for appetite  change, diaphoresis, fatigue and unexpected weight change.  HENT: Negative.    Eyes: Negative.   Respiratory:  Positive for cough. Negative for chest tightness, shortness of breath and wheezing.   Cardiovascular:  Negative for chest pain, palpitations and leg swelling.  Gastrointestinal:  Negative for abdominal pain, diarrhea, nausea and vomiting.  Endocrine: Negative.   Genitourinary: Negative.  Negative for difficulty urinating.  Musculoskeletal: Negative.   Skin: Negative.  Negative for color change and rash.  Neurological: Negative.   Hematological:  Negative for adenopathy. Does not bruise/bleed easily.  Psychiatric/Behavioral: Negative.      Objective:  BP 124/78 (BP Location: Left Arm, Patient Position: Sitting, Cuff Size: Normal)   Pulse 78   Temp 98 F (36.7 C) (Oral)   Resp 16   Ht 5\' 3"  (1.6 m)   Wt 178 lb (80.7 kg)   SpO2 100%   BMI 31.53 kg/m   BP Readings from Last 3 Encounters:  06/19/22 124/78  05/04/22 (!) 146/88  02/10/22 118/80    Wt Readings from Last 3 Encounters:  06/19/22 178 lb (80.7 kg)  05/04/22 182 lb (82.6 kg)  02/10/22 181 lb 6.4 oz (82.3 kg)    Physical Exam Vitals reviewed.  Constitutional:  Appearance: Normal appearance.  Eyes:     General: No scleral icterus.    Pupils: Pupils are equal, round, and reactive to light.  Cardiovascular:     Rate and Rhythm: Normal rate and regular rhythm.     Heart sounds: No murmur heard. Pulmonary:     Effort: Pulmonary effort is normal.     Breath sounds: No stridor. No wheezing, rhonchi or rales.  Abdominal:     General: Abdomen is flat.     Palpations: There is no mass.     Tenderness: There is no abdominal tenderness. There is no guarding.     Hernia: No hernia is present.  Musculoskeletal:        General: Normal range of motion.     Cervical back: Neck supple.     Right lower leg: No edema.     Left lower leg: No edema.  Lymphadenopathy:     Cervical: No cervical adenopathy.   Skin:    General: Skin is warm and dry.  Neurological:     General: No focal deficit present.     Mental Status: She is alert.  Psychiatric:        Mood and Affect: Mood normal.        Behavior: Behavior normal.     Lab Results  Component Value Date   WBC 8.2 05/04/2022   HGB 11.8 (L) 05/04/2022   HCT 35.7 (L) 05/04/2022   PLT 389.0 05/04/2022   GLUCOSE 90 05/04/2022   CHOL 233 (A) 11/19/2021   TRIG 79 11/19/2021   HDL 71 (A) 11/19/2021   LDLCALC 144 11/19/2021   ALT 14 05/04/2022   AST 14 05/04/2022   NA 138 05/04/2022   K 3.8 05/04/2022   CL 105 05/04/2022   CREATININE 0.83 05/04/2022   BUN 16 05/04/2022   CO2 26 05/04/2022   TSH 3.46 12/25/2021   HGBA1C 6.3 05/04/2022    MM 3D SCREEN BREAST BILATERAL  Result Date: 08/19/2021 CLINICAL DATA:  Screening. EXAM: DIGITAL SCREENING BILATERAL MAMMOGRAM WITH TOMOSYNTHESIS AND CAD TECHNIQUE: Bilateral screening digital craniocaudal and mediolateral oblique mammograms were obtained. Bilateral screening digital breast tomosynthesis was performed. The images were evaluated with computer-aided detection. COMPARISON:  Previous exam(s). ACR Breast Density Category b: There are scattered areas of fibroglandular density. FINDINGS: There are no findings suspicious for malignancy. IMPRESSION: No mammographic evidence of malignancy. A result letter of this screening mammogram will be mailed directly to the patient. RECOMMENDATION: Screening mammogram in one year. (Code:SM-B-01Y) BI-RADS CATEGORY  1: Negative. Electronically Signed   By: Gerome Sam III M.D.   On: 08/19/2021 19:38    DG Chest 2 View  Result Date: 06/19/2022 CLINICAL DATA:  Cough EXAM: CHEST - 2 VIEW COMPARISON:  CT chest dated March 05, 2021 FINDINGS: Heart size and mediastinal contours are within normal limits. Right middle lobe nodular opacity. Both lungs are otherwise clear. The visualized skeletal structures are unremarkable. IMPRESSION: Right middle lobe nodular  opacity, likely correlates with pulmonary nodule seen on prior CT. Recommend CT chest to confirm stability. Electronically Signed   By: Allegra Lai M.D.   On: 06/19/2022 10:34     Assessment & Plan:   Sunshine was seen today for uri.  Diagnoses and all orders for this visit:  Primary hypertension- Her blood pressure is well-controlled.  Anemia due to zinc deficiency  Subacute cough -     DG Chest 2 View; Future -     chlorpheniramine-HYDROcodone (TUSSIONEX) 10-8 MG/5ML; Take 5  mLs by mouth every 12 (twelve) hours as needed for cough.  Exposure to influenza -     oseltamivir (TAMIFLU) 75 MG capsule; Take 1 capsule (75 mg total) by mouth daily for 10 days.  Nodule of middle lobe of right lung -     CT Chest Wo Contrast; Future  LRTI (lower respiratory tract infection)- Will treat with Augmentin and will offer symptom relief. -     amoxicillin-clavulanate (AUGMENTIN) 875-125 MG tablet; Take 1 tablet by mouth 2 (two) times daily for 7 days. -     chlorpheniramine-HYDROcodone (TUSSIONEX) 10-8 MG/5ML; Take 5 mLs by mouth every 12 (twelve) hours as needed for cough.   I am having Casey Serrano start on oseltamivir, amoxicillin-clavulanate, and chlorpheniramine-HYDROcodone. I am also having her maintain her Calcium Carbonate+Vitamin D, escitalopram, progesterone, valACYclovir, ALPRAZolam, Krill Oil, Hair Skin and Nails Formula, ibuprofen, Vitamin D (Ergocalciferol), estradiol, famotidine, spironolactone, pantoprazole, olmesartan, zinc gluconate, and Rybelsus.  Meds ordered this encounter  Medications   oseltamivir (TAMIFLU) 75 MG capsule    Sig: Take 1 capsule (75 mg total) by mouth daily for 10 days.    Dispense:  10 capsule    Refill:  0   amoxicillin-clavulanate (AUGMENTIN) 875-125 MG tablet    Sig: Take 1 tablet by mouth 2 (two) times daily for 7 days.    Dispense:  14 tablet    Refill:  0   chlorpheniramine-HYDROcodone (TUSSIONEX) 10-8 MG/5ML    Sig: Take 5 mLs by mouth every  12 (twelve) hours as needed for cough.    Dispense:  70 mL    Refill:  0     Follow-up: Return in about 3 months (around 09/18/2022).  Sanda Linger, MD

## 2022-07-13 DIAGNOSIS — Z23 Encounter for immunization: Secondary | ICD-10-CM | POA: Diagnosis not present

## 2022-07-14 ENCOUNTER — Encounter: Payer: Self-pay | Admitting: Internal Medicine

## 2022-07-22 ENCOUNTER — Other Ambulatory Visit: Payer: Self-pay | Admitting: Obstetrics and Gynecology

## 2022-07-22 DIAGNOSIS — Z1231 Encounter for screening mammogram for malignant neoplasm of breast: Secondary | ICD-10-CM

## 2022-07-23 ENCOUNTER — Ambulatory Visit
Admission: RE | Admit: 2022-07-23 | Discharge: 2022-07-23 | Disposition: A | Discharge status: Home / Self Care | Payer: BC Managed Care – PPO | Source: Ambulatory Visit | Attending: Internal Medicine | Admitting: Internal Medicine

## 2022-07-23 DIAGNOSIS — R911 Solitary pulmonary nodule: Secondary | ICD-10-CM | POA: Diagnosis not present

## 2022-07-28 ENCOUNTER — Encounter: Payer: Self-pay | Admitting: Internal Medicine

## 2022-07-28 ENCOUNTER — Other Ambulatory Visit: Payer: Self-pay | Admitting: Internal Medicine

## 2022-07-28 DIAGNOSIS — I10 Essential (primary) hypertension: Secondary | ICD-10-CM

## 2022-08-16 ENCOUNTER — Other Ambulatory Visit: Payer: Self-pay | Admitting: Internal Medicine

## 2022-08-16 DIAGNOSIS — I10 Essential (primary) hypertension: Secondary | ICD-10-CM

## 2022-09-09 ENCOUNTER — Ambulatory Visit
Admission: RE | Admit: 2022-09-09 | Discharge: 2022-09-09 | Disposition: A | Discharge status: Home / Self Care | Payer: BC Managed Care – PPO | Source: Ambulatory Visit | Attending: Obstetrics and Gynecology | Admitting: Obstetrics and Gynecology

## 2022-09-09 DIAGNOSIS — Z1231 Encounter for screening mammogram for malignant neoplasm of breast: Secondary | ICD-10-CM

## 2022-09-14 ENCOUNTER — Other Ambulatory Visit: Payer: Self-pay | Admitting: Internal Medicine

## 2022-09-14 DIAGNOSIS — I10 Essential (primary) hypertension: Secondary | ICD-10-CM

## 2022-09-14 DIAGNOSIS — E269 Hyperaldosteronism, unspecified: Secondary | ICD-10-CM

## 2022-10-27 DIAGNOSIS — Z6833 Body mass index (BMI) 33.0-33.9, adult: Secondary | ICD-10-CM | POA: Diagnosis not present

## 2022-10-27 DIAGNOSIS — Z01419 Encounter for gynecological examination (general) (routine) without abnormal findings: Secondary | ICD-10-CM | POA: Diagnosis not present

## 2022-11-04 ENCOUNTER — Other Ambulatory Visit: Payer: Self-pay | Admitting: Internal Medicine

## 2022-11-04 DIAGNOSIS — K219 Gastro-esophageal reflux disease without esophagitis: Secondary | ICD-10-CM

## 2022-11-28 ENCOUNTER — Other Ambulatory Visit: Payer: Self-pay | Admitting: Internal Medicine

## 2022-11-28 DIAGNOSIS — I10 Essential (primary) hypertension: Secondary | ICD-10-CM

## 2022-12-13 ENCOUNTER — Other Ambulatory Visit: Payer: Self-pay | Admitting: Internal Medicine

## 2022-12-13 DIAGNOSIS — I10 Essential (primary) hypertension: Secondary | ICD-10-CM

## 2022-12-13 DIAGNOSIS — E269 Hyperaldosteronism, unspecified: Secondary | ICD-10-CM

## 2022-12-14 ENCOUNTER — Encounter: Payer: Self-pay | Admitting: Internal Medicine

## 2023-01-05 ENCOUNTER — Ambulatory Visit (INDEPENDENT_AMBULATORY_CARE_PROVIDER_SITE_OTHER): Payer: BC Managed Care – PPO | Admitting: Internal Medicine

## 2023-01-05 ENCOUNTER — Encounter: Payer: Self-pay | Admitting: Internal Medicine

## 2023-01-05 VITALS — BP 132/76 | HR 77 | Temp 98.6°F | Resp 16 | Ht 63.0 in | Wt 187.0 lb

## 2023-01-05 DIAGNOSIS — R7303 Prediabetes: Secondary | ICD-10-CM | POA: Diagnosis not present

## 2023-01-05 DIAGNOSIS — D518 Other vitamin B12 deficiency anemias: Secondary | ICD-10-CM

## 2023-01-05 DIAGNOSIS — Z0000 Encounter for general adult medical examination without abnormal findings: Secondary | ICD-10-CM

## 2023-01-05 DIAGNOSIS — Z0001 Encounter for general adult medical examination with abnormal findings: Secondary | ICD-10-CM | POA: Diagnosis not present

## 2023-01-05 DIAGNOSIS — D539 Nutritional anemia, unspecified: Secondary | ICD-10-CM

## 2023-01-05 DIAGNOSIS — Z1159 Encounter for screening for other viral diseases: Secondary | ICD-10-CM

## 2023-01-05 DIAGNOSIS — I10 Essential (primary) hypertension: Secondary | ICD-10-CM

## 2023-01-05 DIAGNOSIS — E785 Hyperlipidemia, unspecified: Secondary | ICD-10-CM | POA: Diagnosis not present

## 2023-01-05 DIAGNOSIS — I4892 Unspecified atrial flutter: Secondary | ICD-10-CM

## 2023-01-05 DIAGNOSIS — D538 Other specified nutritional anemias: Secondary | ICD-10-CM | POA: Diagnosis not present

## 2023-01-05 DIAGNOSIS — E269 Hyperaldosteronism, unspecified: Secondary | ICD-10-CM

## 2023-01-05 DIAGNOSIS — Z114 Encounter for screening for human immunodeficiency virus [HIV]: Secondary | ICD-10-CM

## 2023-01-05 NOTE — Patient Instructions (Signed)
Female Infertility  Female infertility refers to a woman's inability to get pregnant (conceive) after a year of having sex regularly (or after 6 months in women over age 57) without using birth control. Infertility can also mean that a woman is not able to carry a pregnancy to full term. Both women and men can experience fertility problems. What are the causes? This condition may be caused by: Problems with reproductive organs. Infertility can result if a woman: Is not ovulating or is ovulating irregularly. Has a blockage or scarring in the fallopian tubes. Has uterine fibroids. This is a benign mass of tissue or muscle (tumor) that can develop in the uterus. Has an abnormally shaped uterus. Has an abnormally short cervix or a cervix that does not remain closed during a pregnancy. Certain medical conditions. These may include: Polycystic ovary syndrome (PCOS). This is a hormonal disorder that can cause small cysts to grow on the ovaries. This is the most common cause of infertility in women. Endometriosis. This is a condition in which the tissue that lines the uterus (endometrium) grows outside of its normal location. Cancer and cancer treatments, such as chemotherapy or radiation. Premature ovarian failure. This is when ovaries stop producing eggs and hormones before age 25. Sexually transmitted infections, such as chlamydia or gonorrhea. Autoimmune disorders. These are disorders in which the body's defense system (immune system) attacks normal, healthy cells. Infertility can be linked to more than one cause. For some women, the cause of infertility is not known (unexplained infertility). What increases the risk? The following factors may make you more likely to develop this condition: Age. A woman's fertility declines with age, especially after her mid-30s. Stress. Smoking. Being underweight or overweight. Drinking too much alcohol. Using drugs such as anabolic steroids, cocaine, and  marijuana. Exercising excessively. What are the signs or symptoms? The main sign of infertility in women is the inability to get pregnant or carry a pregnancy to full term. How is this diagnosed? This condition may be diagnosed by: Checking whether you are ovulating each month. The tests may include: Blood tests to check hormone levels. An ultrasound of the ovaries. Taking a sample of the tissue that lines the uterus and checking it under a microscope (endometrial biopsy). Doing additional tests. This is done if ovulation is normal. Tests may include: Hysterosalpingogram. This X-ray test can show the shape of the uterus and whether the fallopian tubes are open. Laparoscopy. This test uses a lighted tube (laparoscope) to look for problems in the fallopian tubes and other organs. Transvaginal ultrasound. This imaging test is used to check for abnormalities in the uterus and ovaries. Hysteroscopy. This test uses a lighted tube to check for problems in the cervix and the uterus. To be diagnosed with infertility, both partners will have a physical exam. Both partners will also have an extensive medical and sexual history taken. Additional tests may be done. How is this treated? Treatment depends on the cause of infertility. Most cases of infertility in women are treated with medicine or surgery. Women may take medicine to: Correct ovulation problems. Treat other health conditions. Surgery may be done to: Repair damage to the ovaries, fallopian tubes, cervix, or uterus. Remove growths from the uterus. Remove scar tissue from the uterus, pelvis, or other organs. Assisted reproductive technology (ART) Assisted reproductive technology (ART) refers to all treatments and procedures that combine eggs and sperm outside the body to try to help a couple conceive. ART is often combined with fertility drugs to stimulate  ovulation. Sometimes ART is done using eggs retrieved from another woman's body (donor  eggs) or from previously frozen fertilized eggs (embryos). There are different types of ART. These include: Intrauterine insemination (IUI). A long, thin tube is used to place sperm directly into a woman's uterus. This procedure: Is effective for infertility caused by sperm problems, including low sperm count and low motility. Can be used in combination with fertility drugs. In vitro fertilization (IVF). This is done when a woman's fallopian tubes are blocked or when a man has low sperm count. In this procedure: Fertility drugs are used to stimulate the ovaries to produce multiple eggs. Once mature, these eggs are removed from the body and combined with the sperm to be fertilized. The fertilized eggs are then placed into the woman's uterus. Follow these instructions at home: Lifestyle If you drink alcohol, limit how much you have to 0-1 drink a day. Do not use any products that contain nicotine or tobacco. These products include cigarettes, chewing tobacco, and vaping devices, such as e-cigarettes. If you need help quitting, ask your health care provider. Practice stress reduction techniques that work well for you, such as regular physical activity, meditation, or deep breathing. Make dietary changes to lose weight or maintain a healthy weight. Work with your health care provider and a dietitian to set a weight-loss goal that is healthy and reasonable for you. General instructions Take over-the-counter and prescription medicines only as told by your health care provider. Seek support from a counselor or support group to talk about your concerns related to infertility. Couples counseling may be helpful for you and your partner. Keep all follow-up visits. This is important. Where to find support Resolve - The National Inf[employer] Association: resolve.org Contact a health care provider if: You feel that stress is interfering with your life and relationships. You have side effects from treatments  for infertility. Summary Female infertility refers to a woman's inability to get pregnant (conceive) after a year of having sex regularly (or after 6 months in women over age 57) without using birth control. To be diagnosed with infertility, both partners will have a physical exam. Both partners will also have an extensive medical and sexual history taken. Seek support from a counselor or support group to talk about your concerns related to infertility. Couples counseling may be helpful for you and your partner. This information is not intended to replace advice given to you by your health care provider. Make sure you discuss any questions you have with your health care provider. Document Revised: 08/13/2020 Document Reviewed: 08/13/2020 Elsevier Patient Education  2024 [employer].

## 2023-01-05 NOTE — Progress Notes (Signed)
Subjective:  Patient ID: Casey Serrano, female    DOB: 06/17/1966  Age: 57 y.o. MRN: [id number]  CC: Hypertension, Hyperlipidemia, Anemia, and Annual Exam   HPI Casey Serrano presents for a CPX and f/up ---  She is active and denies DOE, CP, SOB, edema.  Outpatient Medications Prior to Visit  Medication Sig Dispense Refill   ALPRAZolam (XANAX) 0.5 MG tablet Take 0.5 mg by mouth 3 (three) times daily as needed.     Calcium Carbonate+Vitamin D 600-200 MG-UNIT TABS Take 1 tablet by mouth daily.     escitalopram (LEXAPRO) 10 MG tablet Take 10 mg by mouth daily.     estradiol (ESTRACE) 1 MG tablet Take 1 mg by mouth daily.     famotidine (PEPCID) 20 MG tablet TAKE 1 TABLET BY MOUTH EVERYDAY AT BEDTIME PRN     ibuprofen (ADVIL) 200 MG tablet Take 200 mg by mouth every 6 (six) hours as needed.     Krill Oil 500 MG CAPS Take 1 capsule by mouth daily.     Multiple Vitamins-Minerals (HAIR SKIN AND NAILS FORMULA) TABS Take 1 tablet by mouth daily.     pantoprazole (PROTONIX) 40 MG tablet TAKE 1 TABLET BY MOUTH EVERY DAY 90 tablet 1   progesterone (PROMETRIUM) 100 MG capsule progesterone micronized 100 mg capsule  TAKE 1 CAPSULE BY MOUTH EVERY DAY     valACYclovir (VALTREX) 1000 MG tablet Take 1,000 mg by mouth daily as needed.     Vitamin D, Ergocalciferol, (DRISDOL) 1.25 MG (50000 UNIT) CAPS capsule Take 1 capsule (50,000 Units total) by mouth every 7 (seven) days. 4 capsule 0   chlorpheniramine-HYDROcodone (TUSSIONEX) 10-8 MG/5ML Take 5 mLs by mouth every 12 (twelve) hours as needed for cough. 70 mL 0   olmesartan (BENICAR) 40 MG tablet TAKE 1 TABLET BY MOUTH EVERY DAY 90 tablet 1   spironolactone (ALDACTONE) 25 MG tablet TAKE 1 TABLET (25 MG TOTAL) BY MOUTH DAILY. 90 tablet 0   zinc gluconate 50 MG tablet Take 1 tablet (50 mg total) by mouth daily. 90 tablet 1   Semaglutide (RYBELSUS) 7 MG TABS Take 7 mg by mouth daily. 90 tablet 0   No facility-administered medications prior to visit.     ROS Review of Systems  Constitutional:  Positive for fatigue. Negative for chills, diaphoresis, fever and unexpected weight change.  HENT: Negative.    Eyes: Negative.  Negative for visual disturbance.  Respiratory: Negative.  Negative for cough, chest tightness, shortness of breath and wheezing.   Cardiovascular:  Negative for chest pain, palpitations and leg swelling.  Gastrointestinal:  Negative for abdominal pain, diarrhea, nausea and vomiting.  Endocrine: Negative.   Genitourinary:  Negative for difficulty urinating.  Musculoskeletal:  Negative for arthralgias and myalgias.  Skin: Negative.   Neurological: Negative.  Negative for dizziness, weakness, light-headedness and headaches.  Hematological:  Negative for adenopathy. Does not bruise/bleed easily.  Psychiatric/Behavioral: Negative.      Objective:  BP 132/76 (BP Location: Left Arm, Patient Position: Sitting, Cuff Size: Large)   Pulse 77   Temp 98.6 F (37 C) (Oral)   Resp 16   Ht 5\' 3"  (1.6 m)   Wt 187 lb (84.8 kg)   SpO2 95%   BMI 33.13 kg/m   BP Readings from Last 3 Encounters:  01/05/23 132/76  06/19/22 124/78  05/04/22 (!) 146/88    Wt Readings from Last 3 Encounters:  01/05/23 187 lb (84.8 kg)  06/19/22 178 lb (80.7 kg)  05/04/22 182 lb (82.6 kg)    Physical Exam Vitals reviewed.  Constitutional:      Appearance: Normal appearance.  HENT:     Nose: Nose normal.     Mouth/Throat:     Mouth: Mucous membranes are moist.  Eyes:     General: No scleral icterus.    Conjunctiva/sclera: Conjunctivae normal.  Cardiovascular:     Rate and Rhythm: Normal rate and regular rhythm.     Heart sounds: No murmur heard.    No friction rub. No gallop.  Pulmonary:     Effort: Pulmonary effort is normal.     Breath sounds: No stridor. No wheezing, rhonchi or rales.  Abdominal:     General: Abdomen is flat.     Palpations: There is no mass.     Tenderness: There is no abdominal tenderness. There is no  guarding.     Hernia: No hernia is present.  Musculoskeletal:        General: Normal range of motion.     Cervical back: Neck supple.     Right lower leg: No edema.     Left lower leg: No edema.  Lymphadenopathy:     Cervical: No cervical adenopathy.  Skin:    General: Skin is warm and dry.  Neurological:     General: No focal deficit present.     Mental Status: She is alert. Mental status is at baseline.  Psychiatric:        Mood and Affect: Mood normal.        Behavior: Behavior normal.     Lab Results  Component Value Date   WBC 7.1 01/05/2023   HGB 11.4 (L) 01/05/2023   HCT 34.3 (L) 01/05/2023   PLT 402.0 (H) 01/05/2023   GLUCOSE 90 05/04/2022   CHOL 207 (H) 01/05/2023   TRIG 235.0 (H) 01/05/2023   HDL 54.50 01/05/2023   LDLDIRECT 120.0 01/05/2023   LDLCALC 144 11/19/2021   ALT 13 01/05/2023   AST 13 01/05/2023   NA 138 05/04/2022   K 3.8 05/04/2022   CL 105 05/04/2022   CREATININE 0.83 05/04/2022   BUN 16 05/04/2022   CO2 26 05/04/2022   TSH 4.06 01/05/2023   HGBA1C 5.9 01/05/2023    MM 3D SCREEN BREAST BILATERAL  Result Date: 09/10/2022 CLINICAL DATA:  Screening. EXAM: DIGITAL SCREENING BILATERAL MAMMOGRAM WITH TOMOSYNTHESIS AND CAD TECHNIQUE: Bilateral screening digital craniocaudal and mediolateral oblique mammograms were obtained. Bilateral screening digital breast tomosynthesis was performed. The images were evaluated with computer-aided detection. COMPARISON:  Previous exam(s). ACR Breast Density Category a: The breasts are almost entirely fatty. FINDINGS: There are no findings suspicious for malignancy. IMPRESSION: No mammographic evidence of malignancy. A result letter of this screening mammogram will be mailed directly to the patient. RECOMMENDATION: Screening mammogram in one year. (Code:SM-B-01Y) BI-RADS CATEGORY  1: Negative. Electronically Signed   By: Frederico Hamman M.D.   On: 09/10/2022 13:38    Assessment & Plan:   Primary hypertension- Her  blood pressure is adequately well-controlled. -     Hepatic function panel; Future -     Spironolactone; Take 1 tablet (25 mg total) by mouth daily.  Dispense: 90 tablet; Refill: 1 -     Olmesartan Medoxomil; Take 1 tablet (40 mg total) by mouth daily.  Dispense: 90 tablet; Refill: 1  Anemia due to zinc deficiency -     CBC with Differential/Platelet; Future -     Zinc Gluconate; Take 1 tablet (50 mg total)  by mouth daily.  Dispense: 90 tablet; Refill: 1  Atrial flutter by electrocardiogram (HCC) -     TSH; Future  Dyslipidemia, goal LDL below 70- Statin is not indicated. -     Lipoprotein A (LPA); Future -     Lipid panel; Future -     Hepatic function panel; Future  Prediabetes -     Hemoglobin A1c; Future  Encounter for screening for HIV -     HIV Antibody (routine testing w rflx); Future  Need for hepatitis C screening test -     Hepatitis C antibody; Future  Encounter for general adult medical examination with abnormal findings- Exam completed, labs reviewed, vaccines reviewed, cancer screenings are up-to-date, patient education was given. -     HIV Antibody (routine testing w rflx); Future -     Hepatitis C antibody; Future  Deficiency anemia- Will start parenteral B12 replacement therapy. -     IBC + Ferritin; Future -     Reticulocytes; Future -     Vitamin B1; Future -     Vitamin B12; Future -     Folate; Future  Hyperaldosteronism (HCC) -     Spironolactone; Take 1 tablet (25 mg total) by mouth daily.  Dispense: 90 tablet; Refill: 1  Vitamin B12 deficiency (dietary) anemia  Other orders -     LDL cholesterol, direct     Follow-up: Return in about 6 months (around 07/08/2023).  Sanda Linger, MD

## 2023-01-06 ENCOUNTER — Encounter: Payer: Self-pay | Admitting: Internal Medicine

## 2023-01-06 DIAGNOSIS — D539 Nutritional anemia, unspecified: Secondary | ICD-10-CM | POA: Insufficient documentation

## 2023-01-06 DIAGNOSIS — Z1159 Encounter for screening for other viral diseases: Secondary | ICD-10-CM | POA: Insufficient documentation

## 2023-01-06 DIAGNOSIS — Z0001 Encounter for general adult medical examination with abnormal findings: Secondary | ICD-10-CM | POA: Insufficient documentation

## 2023-01-06 LAB — CBC WITH DIFFERENTIAL/PLATELET
Basophils Absolute: 0.1 10*3/uL (ref 0.0–0.1)
Basophils Relative: 1.1 % (ref 0.0–3.0)
Eosinophils Absolute: 0.2 10*3/uL (ref 0.0–0.7)
Eosinophils Relative: 2.2 % (ref 0.0–5.0)
HCT: 34.3 % — ABNORMAL LOW (ref 36.0–46.0)
Hemoglobin: 11.4 g/dL — ABNORMAL LOW (ref 12.0–15.0)
Lymphocytes Relative: 36.2 % (ref 12.0–46.0)
Lymphs Abs: 2.6 10*3/uL (ref 0.7–4.0)
MCHC: 33.2 g/dL (ref 30.0–36.0)
MCV: 88.8 fl (ref 78.0–100.0)
Monocytes Absolute: 0.4 10*3/uL (ref 0.1–1.0)
Monocytes Relative: 6.3 % (ref 3.0–12.0)
Neutro Abs: 3.8 10*3/uL (ref 1.4–7.7)
Neutrophils Relative %: 54.2 % (ref 43.0–77.0)
Platelets: 402 10*3/uL — ABNORMAL HIGH (ref 150.0–400.0)
RBC: 3.87 Mil/uL (ref 3.87–5.11)
RDW: 13.5 % (ref 11.5–15.5)
WBC: 7.1 10*3/uL (ref 4.0–10.5)

## 2023-01-06 LAB — HEPATIC FUNCTION PANEL
ALT: 13 U/L (ref 0–35)
AST: 13 U/L (ref 0–37)
Albumin: 4 g/dL (ref 3.5–5.2)
Alkaline Phosphatase: 46 U/L (ref 39–117)
Bilirubin, Direct: 0.1 mg/dL (ref 0.0–0.3)
Total Bilirubin: 0.3 mg/dL (ref 0.2–1.2)
Total Protein: 6.9 g/dL (ref 6.0–8.3)

## 2023-01-06 LAB — LIPID PANEL
Cholesterol: 207 mg/dL — ABNORMAL HIGH (ref 0–200)
HDL: 54.5 mg/dL (ref >39.00)
NonHDL: 152.7
Total CHOL/HDL Ratio: 4
Triglycerides: 235 mg/dL — ABNORMAL HIGH (ref 0.0–149.0)
VLDL: 47 mg/dL — ABNORMAL HIGH (ref 0.0–40.0)

## 2023-01-06 LAB — TSH: TSH: 4.06 u[IU]/mL (ref 0.35–5.50)

## 2023-01-06 LAB — LDL CHOLESTEROL, DIRECT: Direct LDL: 120 mg/dL

## 2023-01-06 LAB — HEMOGLOBIN A1C: Hgb A1c MFr Bld: 5.9 % (ref 4.6–6.5)

## 2023-01-06 MED ORDER — ZINC GLUCONATE 50 MG PO TABS: 50.0000 mg | ORAL_TABLET | Freq: Every day | ORAL | 1 refills | Status: DC

## 2023-01-07 ENCOUNTER — Other Ambulatory Visit: Payer: Self-pay | Admitting: Internal Medicine

## 2023-01-07 ENCOUNTER — Other Ambulatory Visit (INDEPENDENT_AMBULATORY_CARE_PROVIDER_SITE_OTHER): Payer: BC Managed Care – PPO

## 2023-01-07 DIAGNOSIS — D539 Nutritional anemia, unspecified: Secondary | ICD-10-CM

## 2023-01-07 DIAGNOSIS — E269 Hyperaldosteronism, unspecified: Secondary | ICD-10-CM

## 2023-01-07 DIAGNOSIS — I10 Essential (primary) hypertension: Secondary | ICD-10-CM

## 2023-01-07 LAB — LIPOPROTEIN A (LPA): Lipoprotein (a): <10 nmol/L (ref <75)

## 2023-01-07 LAB — HEPATITIS C ANTIBODY: Hepatitis C Ab: NONREACTIVE

## 2023-01-07 LAB — HIV ANTIBODY (ROUTINE TESTING W REFLEX): HIV 1&2 Ab, 4th Generation: NONREACTIVE

## 2023-01-08 DIAGNOSIS — D518 Other vitamin B12 deficiency anemias: Secondary | ICD-10-CM | POA: Insufficient documentation

## 2023-01-08 LAB — VITAMIN B12: Vitamin B-12: 217 pg/mL (ref 211–911)

## 2023-01-08 LAB — IBC + FERRITIN
Ferritin: 54.7 ng/mL (ref 10.0–291.0)
Iron: 59 ug/dL (ref 42–145)
Saturation Ratios: 12.7 % — ABNORMAL LOW (ref 20.0–50.0)
TIBC: 464.8 ug/dL — ABNORMAL HIGH (ref 250.0–450.0)
Transferrin: 332 mg/dL (ref 212.0–360.0)

## 2023-01-08 LAB — FOLATE: Folate: 15.6 ng/mL (ref >5.9)

## 2023-01-08 MED ORDER — SPIRONOLACTONE 25 MG PO TABS
25.0000 mg | ORAL_TABLET | Freq: Every day | ORAL | 1 refills | Status: DC
Start: 2023-01-08 — End: 2023-04-07

## 2023-01-08 MED ORDER — OLMESARTAN MEDOXOMIL 40 MG PO TABS
40.0000 mg | ORAL_TABLET | Freq: Every day | ORAL | 1 refills | Status: DC
Start: 2023-01-08 — End: 2023-08-24

## 2023-01-11 LAB — VITAMIN B1: Vitamin B1 (Thiamine): 13 nmol/L (ref 8–30)

## 2023-01-11 LAB — RETICULOCYTES
ABS Retic: 53170 cells/uL (ref 20000–80000)
Retic Ct Pct: 1.3 %

## 2023-01-13 ENCOUNTER — Ambulatory Visit (INDEPENDENT_AMBULATORY_CARE_PROVIDER_SITE_OTHER): Payer: BC Managed Care – PPO

## 2023-01-13 DIAGNOSIS — E538 Deficiency of other specified B group vitamins: Secondary | ICD-10-CM | POA: Diagnosis not present

## 2023-01-13 MED ORDER — CYANOCOBALAMIN 1000 MCG/ML IJ SOLN
1000.0000 ug | Freq: Once | INTRAMUSCULAR | Status: AC
Start: 2023-01-13 — End: 2023-01-13
  Administered 2023-01-13: 1000 ug via INTRAMUSCULAR

## 2023-01-13 NOTE — Progress Notes (Signed)
After obtaining consent, and per orders of Dr. Yetta Barre, injection of B12 was given by Ferdie Ping. Patient tolerated well instructed to report any adverse reaction to me immediately.

## 2023-01-27 ENCOUNTER — Encounter (INDEPENDENT_AMBULATORY_CARE_PROVIDER_SITE_OTHER): Payer: Self-pay

## 2023-02-08 DIAGNOSIS — F418 Other specified anxiety disorders: Secondary | ICD-10-CM | POA: Diagnosis not present

## 2023-02-08 DIAGNOSIS — R7989 Other specified abnormal findings of blood chemistry: Secondary | ICD-10-CM | POA: Insufficient documentation

## 2023-02-08 DIAGNOSIS — R5383 Other fatigue: Secondary | ICD-10-CM | POA: Diagnosis not present

## 2023-02-08 DIAGNOSIS — R6882 Decreased libido: Secondary | ICD-10-CM | POA: Diagnosis not present

## 2023-02-15 ENCOUNTER — Ambulatory Visit (INDEPENDENT_AMBULATORY_CARE_PROVIDER_SITE_OTHER): Payer: BC Managed Care – PPO

## 2023-02-15 DIAGNOSIS — E538 Deficiency of other specified B group vitamins: Secondary | ICD-10-CM | POA: Diagnosis not present

## 2023-02-15 MED ORDER — CYANOCOBALAMIN 1000 MCG/ML IJ SOLN
1000.0000 ug | Freq: Once | INTRAMUSCULAR | Status: AC
Start: 2023-02-15 — End: 2023-02-15
  Administered 2023-02-15: 1000 ug via INTRAMUSCULAR

## 2023-02-15 NOTE — Progress Notes (Signed)
After obtaining consent, and per orders of Dr. Yetta Barre, injection of B12 was given by Lorelle Gibbs, RN. Patient tolerated well instructed to report any adverse reaction to me immediately.

## 2023-02-17 DIAGNOSIS — R891 Abnormal level of hormones in specimens from other organs, systems and tissues: Secondary | ICD-10-CM | POA: Diagnosis not present

## 2023-02-19 ENCOUNTER — Encounter: Payer: Self-pay | Admitting: Internal Medicine

## 2023-02-25 ENCOUNTER — Other Ambulatory Visit: Payer: Self-pay | Admitting: Internal Medicine

## 2023-02-25 DIAGNOSIS — K219 Gastro-esophageal reflux disease without esophagitis: Secondary | ICD-10-CM

## 2023-03-02 ENCOUNTER — Other Ambulatory Visit: Payer: Self-pay | Admitting: Internal Medicine

## 2023-03-02 DIAGNOSIS — D518 Other vitamin B12 deficiency anemias: Secondary | ICD-10-CM

## 2023-03-02 MED ORDER — CYANOCOBALAMIN 1000 MCG/ML IJ SOLN
1000.0000 ug | INTRAMUSCULAR | 0 refills | Status: DC
Start: 2023-03-02 — End: 2023-11-19

## 2023-03-10 ENCOUNTER — Other Ambulatory Visit: Payer: Self-pay | Admitting: Internal Medicine

## 2023-03-10 DIAGNOSIS — I10 Essential (primary) hypertension: Secondary | ICD-10-CM

## 2023-03-12 ENCOUNTER — Other Ambulatory Visit: Payer: Self-pay | Admitting: Internal Medicine

## 2023-03-12 DIAGNOSIS — R911 Solitary pulmonary nodule: Secondary | ICD-10-CM

## 2023-03-19 DIAGNOSIS — F418 Other specified anxiety disorders: Secondary | ICD-10-CM | POA: Diagnosis not present

## 2023-03-19 DIAGNOSIS — R5383 Other fatigue: Secondary | ICD-10-CM | POA: Diagnosis not present

## 2023-03-19 DIAGNOSIS — R6882 Decreased libido: Secondary | ICD-10-CM | POA: Diagnosis not present

## 2023-03-25 ENCOUNTER — Ambulatory Visit: Payer: BC Managed Care – PPO

## 2023-04-07 ENCOUNTER — Other Ambulatory Visit: Payer: Self-pay | Admitting: Internal Medicine

## 2023-04-07 DIAGNOSIS — E269 Hyperaldosteronism, unspecified: Secondary | ICD-10-CM

## 2023-04-07 DIAGNOSIS — I10 Essential (primary) hypertension: Secondary | ICD-10-CM

## 2023-05-14 ENCOUNTER — Other Ambulatory Visit: Payer: Self-pay | Admitting: Internal Medicine

## 2023-05-14 DIAGNOSIS — D538 Other specified nutritional anemias: Secondary | ICD-10-CM

## 2023-05-19 DIAGNOSIS — R891 Abnormal level of hormones in specimens from other organs, systems and tissues: Secondary | ICD-10-CM | POA: Diagnosis not present

## 2023-05-25 IMAGING — MG MM DIGITAL SCREENING BILAT W/ TOMO AND CAD
8 series · 8 of 24 positions shown · non-contrast
Comparison: Previous exam(s).

CLINICAL DATA: Screening.

EXAM:
DIGITAL SCREENING BILATERAL MAMMOGRAM WITH TOMOSYNTHESIS AND CAD
TECHNIQUE: Bilateral screening digital craniocaudal and mediolateral oblique
mammograms were obtained. Bilateral screening digital breast
tomosynthesis was performed. The images were evaluated with
computer-aided detection.

[R CC synth-2D]
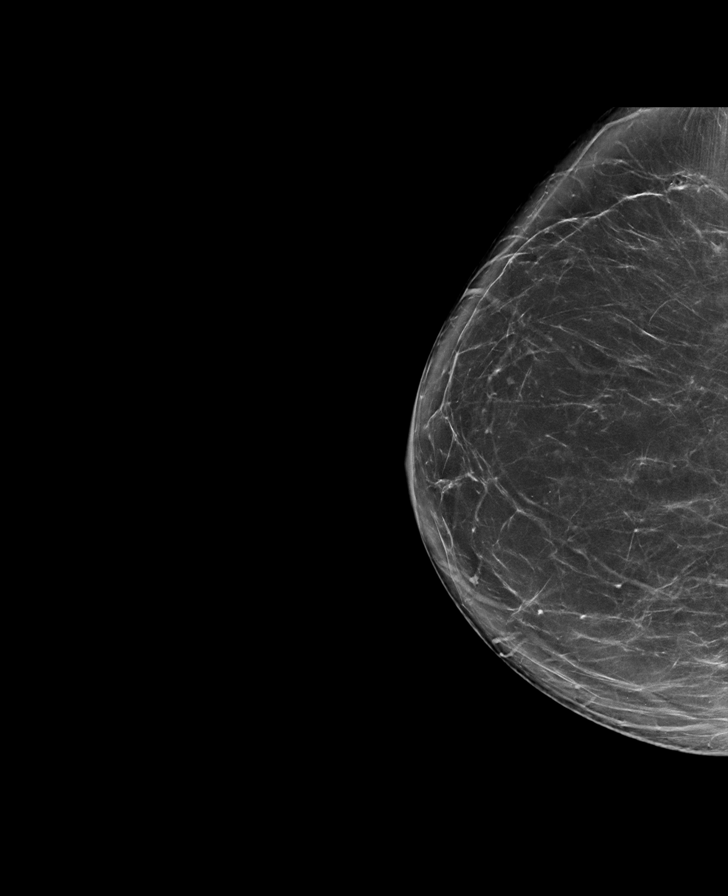

[R MLO synth-2D]
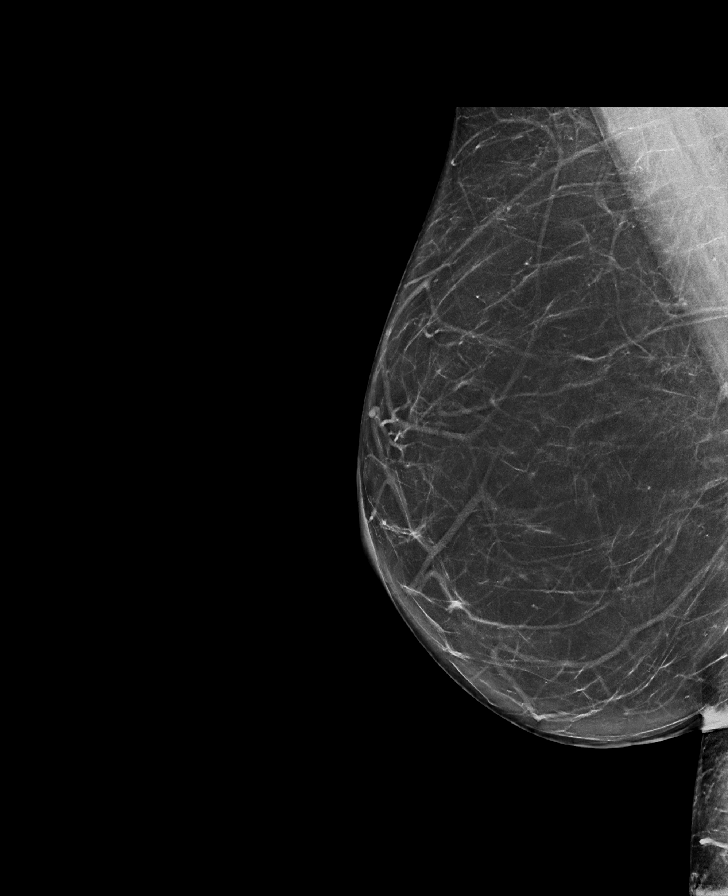

[L CC synth-2D]
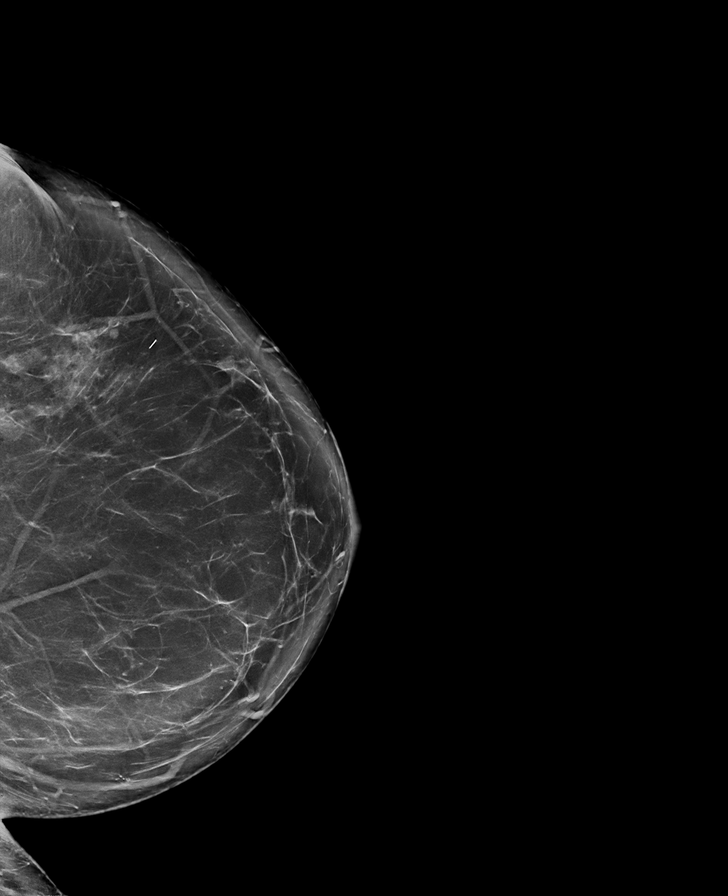

[L MLO synth-2D]
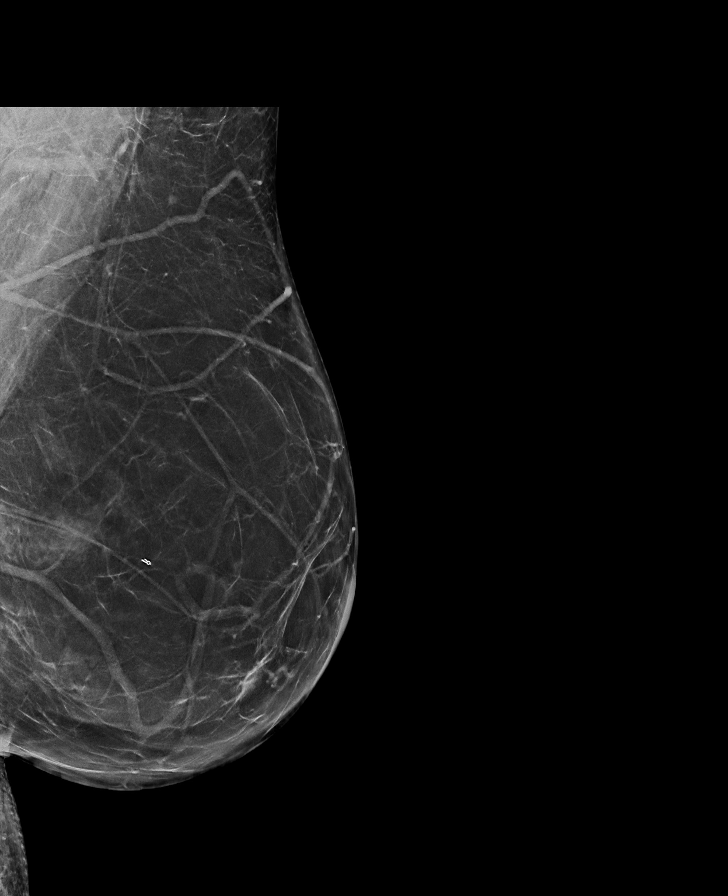

[R CC tomo · tomo slice 44/87.0]
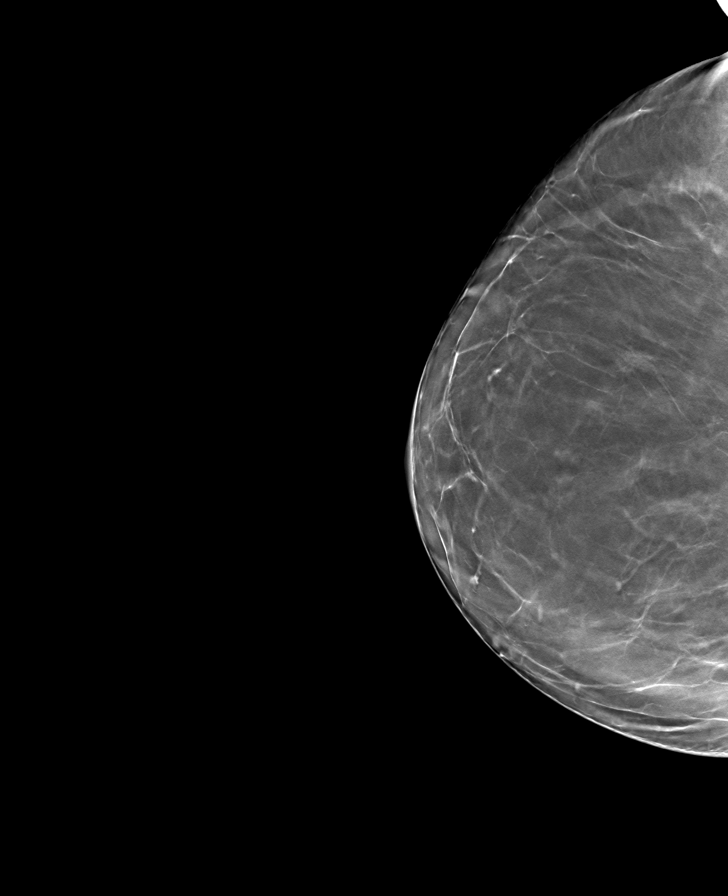

[L CC tomo · tomo slice 45/90.0]
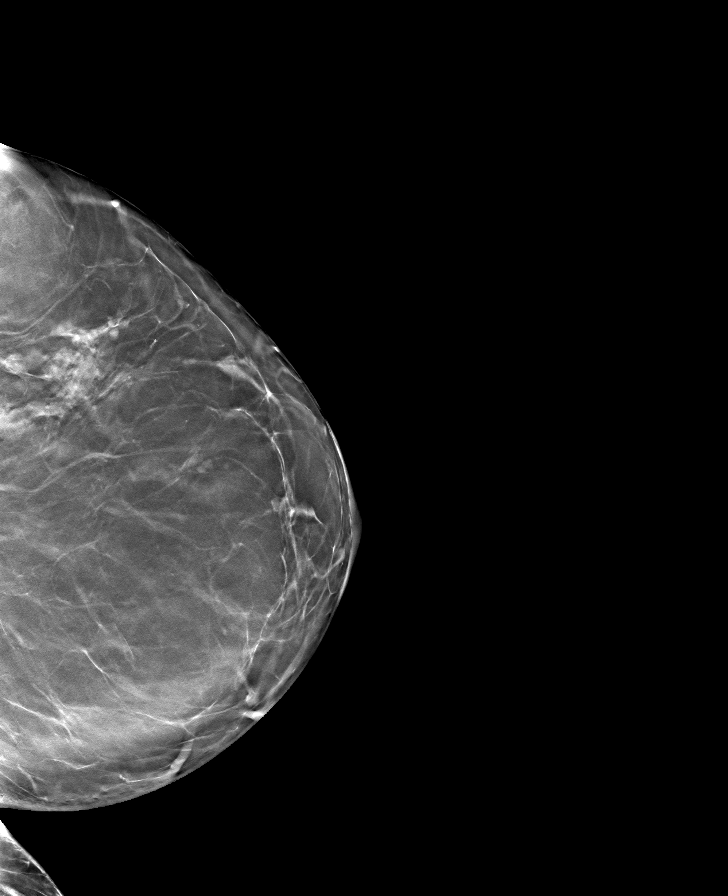

[R MLO tomo · tomo slice 43/84.0]
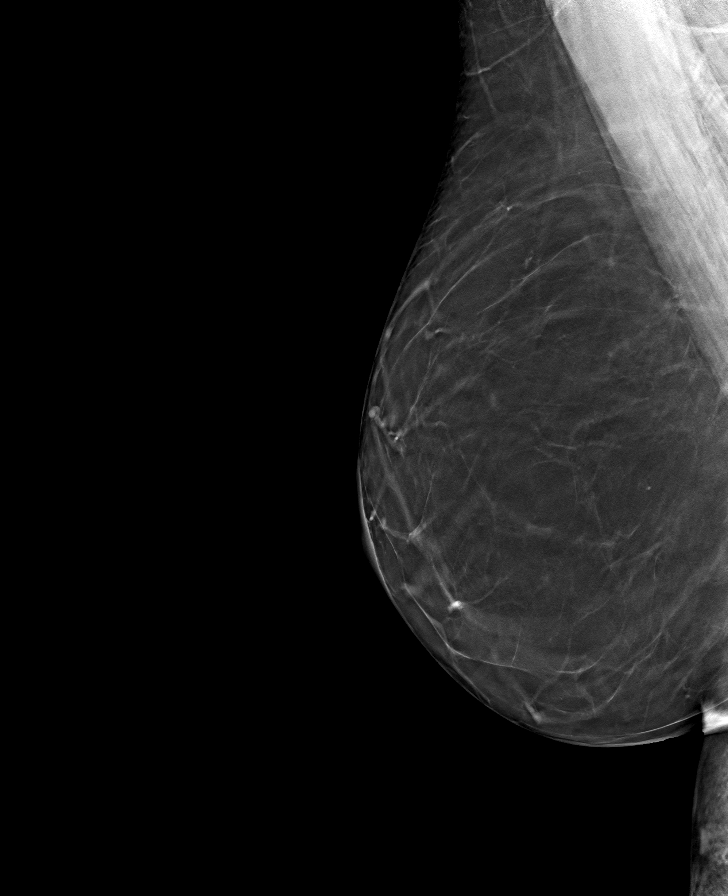

[L MLO tomo · tomo slice 42/83.0]
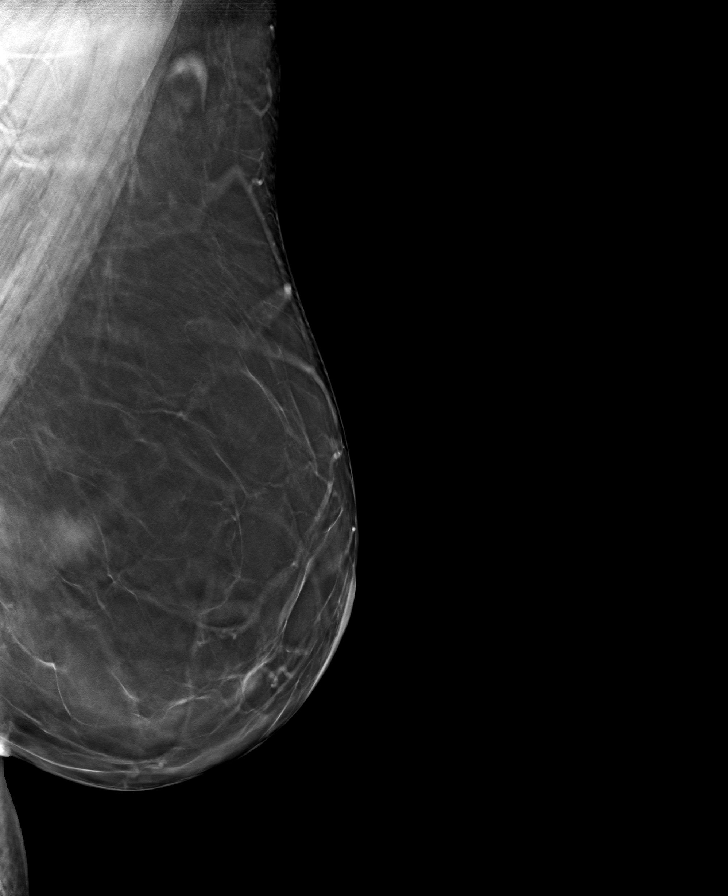

[8 of 24 positions shown; findings below may reference images not displayed]

ACR Breast Density Category b: There are scattered areas of
fibroglandular density.
FINDINGS: There are no findings suspicious for malignancy.
IMPRESSION: No mammographic evidence of malignancy. A result letter of this
screening mammogram will be mailed directly to the patient.

RECOMMENDATION:
Screening mammogram in one year. (Code:51-O-LD2)

BI-RADS CATEGORY  1: Negative.

## 2023-05-30 ENCOUNTER — Encounter: Payer: Self-pay | Admitting: Cardiovascular Disease

## 2023-05-30 NOTE — Progress Notes (Unsigned)
Cardiology Office Note:    Date:  06/01/2023   ID:  Casey Serrano, DOB 12-02-1965, MRN [id number]  PCP:  Etta Grandchild, MD   [facility] Providers Cardiologist:  Lendora Keys       Referring MD: Etta Grandchild, MD   Chief Complaint  Patient presents with   Atrial Fibrillation         History of Present Illness:    Casey Serrano is a 57 y.o. female with a hx of hx of atrial flutter  Hx of HTN Hx of palitations  Event monitor showed atrial fib  She had an allergic reaction to the adhesive on the monitor so she  only able to wear it for 1 day.  Fortunately it showed atrial fibrillation early that morning.  Has had palpitations - she thought it was due to the onset of menopause.  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   Jun 01, 2023 Casey Serrano is seen for follow up of her PAF Getting some exercise   No Cp , no dyspnea No significant palpitations  She is not having any significant episodes of atrial fibrillation.  She is currently not on Eliquis but we certainly need to consider starting Eliquis at some point in the future.  I have asked her to let us know if she starts having more palpitations.  Her stress levels are markedly improved from last year and this seems to have helped.  Have also asked her to let us know if she wants to be screened for sleep apnea.  She does not have any apneic episodes according to her husband.     Past Medical History:  Diagnosis Date   Anemia    Anxiety    Back pain    Breast discharge    Depression    Fatigue    GERD (gastroesophageal reflux  disease)    Hypertension    SOB (shortness of breath) on exertion     Past Surgical History:  Procedure Laterality Date   BREAST BIOPSY Left    benign   BREAST REDUCTION SURGERY     CARPAL TUNNEL RELEASE Bilateral 2008   ENDOMETRIAL ABLATION  2004   REDUCTION MAMMAPLASTY Bilateral    TONSILLECTOMY  2004    Current Medications: Current Meds  Medication Sig   ALPRAZolam (XANAX) 0.5 MG tablet Take 0.5 mg by mouth 3 (three) times daily as needed.   Calcium Carbonate+Vitamin D 600-200 MG-UNIT TABS Take 1 tablet by mouth daily.   cyanocobalamin (VITAMIN B12) 1000 MCG/ML injection Inject 1 mL (1,000 mcg total) into the muscle every 30 (thirty) days.   escitalopram (LEXAPRO) 20 MG tablet Take 20 mg by mouth daily. Takes 1/2 daily   estradiol (ESTRACE) 1 MG tablet Take 1 mg by mouth daily.   Eszopiclone 3 MG TABS Take 3 mg by mouth daily.   famotidine (PEPCID) 20 MG tablet TAKE 1 TABLET BY MOUTH EVERYDAY AT BEDTIME PRN   ibuprofen (ADVIL) 200 MG tablet Take 200 mg  by mouth every 6 (six) hours as needed.   Multiple Vitamins-Minerals (HAIR SKIN AND NAILS FORMULA) TABS Take 1 tablet by mouth daily.   olmesartan (BENICAR) 40 MG tablet Take 1 tablet (40 mg total) by mouth daily.   pantoprazole (PROTONIX) 40 MG tablet TAKE 1 TABLET BY MOUTH EVERY DAY   progesterone (PROMETRIUM) 100 MG capsule progesterone micronized 100 mg capsule  TAKE 1 CAPSULE BY MOUTH EVERY DAY   spironolactone (ALDACTONE) 25 MG tablet TAKE 1 TABLET (25 MG TOTAL) BY MOUTH DAILY.   valACYclovir (VALTREX) 1000 MG tablet Take 1,000 mg by mouth daily as needed.   Vitamin D, Ergocalciferol, (DRISDOL) 1.25 MG (50000 UNIT) CAPS capsule Take 1 capsule (50,000 Units total) by mouth every 7 (seven) days.   Zinc 50 MG TABS TAKE 1 TABLET BY MOUTH EVERY DAY     Allergies:   Patient has no known allergies.   Social History   Socioeconomic History   Marital status: Married    Spouse name: Baldo Ash   Number of children: Not on file    Years of education: Not on file   Highest education level: Not on file  Occupational History   Occupation: Nurse  Tobacco Use   Smoking status: Never    Passive exposure: Past   Smokeless tobacco: Never  Vaping Use   Vaping status: Never Used  Substance and Sexual Activity   Alcohol use: Yes    Alcohol/week: 10.0 standard drinks of alcohol    Types: 10 Glasses of wine per week   Drug use: Never   Sexual activity: Yes    Partners: Male  Other Topics Concern   Not on file  Social History Narrative   Not on file   Social Determinants of Health   Financial Resource Strain: Not on file  Food Insecurity: Not on file  Transportation Needs: Not on file  Physical Activity: Not on file  Stress: Not on file  Social Connections: Not on file     Family History: The patient's family history includes Cancer in her maternal grandmother, mother, and paternal uncle; Diabetes in her father and sister; Emphysema in her maternal grandfather, maternal grandmother, and mother; Heart disease in her father, paternal grandfather, paternal grandmother, and paternal uncle; Hyperlipidemia in her father; Hypertension in her father and mother; Kidney disease in her father; Stroke in her father.  ROS:   Please see the history of present illness.     All other systems reviewed and are negative.  EKGs/Labs/Other Studies Reviewed:    The following studies were reviewed today:   EKG:    EKG Interpretation Date/Time:  Tuesday June 01 2023 08:07:56 EST Ventricular Rate:  77 PR Interval:  148 QRS Duration:  92 QT Interval:  386 QTC Calculation: 436 R Axis:   56  Text Interpretation: Normal sinus rhythm Anterior infarct , age undetermined No previous ECGs available Confirmed by Kristeen Miss [phone]) on 06/01/2023 8:17:04 AM    Recent Labs: 01/05/2023: ALT 13; Hemoglobin 11.4; Platelets 402.0; TSH 4.06  Recent Lipid Panel    Component Value Date/Time   CHOL 207 (H) 01/05/2023 1625   CHOL 213  (H) 02/20/2021 1032   TRIG 235.0 (H) 01/05/2023 1625   HDL 54.50 01/05/2023 1625   HDL 57 02/20/2021 1032   CHOLHDL 4 01/05/2023 1625   VLDL 47.0 (H) 01/05/2023 1625   LDLCALC 144 11/19/2021 0000   LDLCALC 136 (H) 02/20/2021 1032   LDLDIRECT 120.0 01/05/2023 1625     Risk Assessment/Calculations:  CHA2DS2-VASc Score =     This indicates a  % annual risk of stroke. The patient's score is based upon:             Physical Exam:    Physical Exam: Blood pressure 110/74, pulse 85, height 5\' 3"  (1.6 m), weight 186 lb 6.4 oz (84.6 kg), SpO2 98%.       GEN:  Well nourished, well developed in no acute distress HEENT: Normal NECK: No JVD; No carotid bruits LYMPHATICS: No lymphadenopathy CARDIAC: RRR , no murmurs, rubs, gallops RESPIRATORY:  Clear to auscultation without rales, wheezing or rhonchi  ABDOMEN: Soft, non-tender, non-distended MUSCULOSKELETAL:  No edema; No deformity  SKIN: Warm and dry NEUROLOGIC:  Alert and oriented x 3   ASSESSMENT:    1. PAF (paroxysmal atrial fibrillation) (HCC)   2. Primary hypertension     PLAN:    Paroxysmal atrial fibrillation:  Her stress levels have markedly improved since last year.  She is not having any episodes of palpitations at this point.  We discussed the fact that at some point she will need to be started on Eliquis but at this point not sure that she needs Eliquis.  Of asked her to work on exercise and weight loss.  This will help reduce the risk of her developing diabetes.  Will also help with blood pressure control.  Will have her follow-up with [place] in 1 year.         Medication Adjustments/Labs and Tests Ordered: Current medicines are reviewed at length with the patient today.  Concerns regarding medicines are outlined above.  Orders Placed This Encounter  Procedures   EKG 12-Lead   No orders of the defined types were placed in this encounter.   Patient Instructions  Follow-Up: At [facility],  you and your health needs are our priority.  As part of our continuing mission to provide you with exceptional heart care, we have created designated Provider Care Teams.  These Care Teams include your primary Cardiologist (physician) and Advanced Practice Providers (APPs -  Physician Assistants and Nurse Practitioners) who all work together to provide you with the care you need, when you need it.  Your next appointment:   1 year(s)  Provider:   Tonny Bollman, MD     Signed, Kristeen Miss, MD  06/01/2023 8:29 AM     [facility]

## 2023-05-31 ENCOUNTER — Encounter: Payer: Self-pay | Admitting: Internal Medicine

## 2023-06-01 ENCOUNTER — Encounter: Payer: Self-pay | Admitting: Cardiovascular Disease

## 2023-06-01 ENCOUNTER — Ambulatory Visit: Payer: BC Managed Care – PPO | Attending: Cardiovascular Disease | Admitting: Cardiovascular Disease

## 2023-06-01 VITALS — BP 110/74 | HR 85 | Ht 63.0 in | Wt 186.4 lb

## 2023-06-01 DIAGNOSIS — I480 Paroxysmal atrial fibrillation: Secondary | ICD-10-CM | POA: Diagnosis not present

## 2023-06-01 DIAGNOSIS — I10 Essential (primary) hypertension: Secondary | ICD-10-CM | POA: Diagnosis not present

## 2023-06-01 NOTE — Patient Instructions (Signed)

## 2023-06-09 DIAGNOSIS — Z6834 Body mass index (BMI) 34.0-34.9, adult: Secondary | ICD-10-CM | POA: Diagnosis not present

## 2023-06-09 DIAGNOSIS — E663 Overweight: Secondary | ICD-10-CM | POA: Diagnosis not present

## 2023-07-07 ENCOUNTER — Encounter: Payer: Self-pay | Admitting: Internal Medicine

## 2023-07-14 ENCOUNTER — Ambulatory Visit
Admission: RE | Admit: 2023-07-14 | Discharge: 2023-07-14 | Disposition: A | Discharge status: Home / Self Care | Payer: BC Managed Care – PPO | Source: Ambulatory Visit | Attending: Internal Medicine | Admitting: Internal Medicine

## 2023-07-14 DIAGNOSIS — R918 Other nonspecific abnormal finding of lung field: Secondary | ICD-10-CM | POA: Diagnosis not present

## 2023-07-14 DIAGNOSIS — R911 Solitary pulmonary nodule: Secondary | ICD-10-CM

## 2023-07-22 ENCOUNTER — Encounter: Payer: Self-pay | Admitting: Internal Medicine

## 2023-07-26 ENCOUNTER — Other Ambulatory Visit: Payer: BC Managed Care – PPO

## 2023-07-30 ENCOUNTER — Other Ambulatory Visit: Payer: Self-pay | Admitting: Internal Medicine

## 2023-07-30 DIAGNOSIS — I10 Essential (primary) hypertension: Secondary | ICD-10-CM

## 2023-07-30 DIAGNOSIS — E269 Hyperaldosteronism, unspecified: Secondary | ICD-10-CM

## 2023-07-30 NOTE — Telephone Encounter (Signed)
Patient overdue for a F/U, message sent in regards to scheduling

## 2023-08-02 ENCOUNTER — Other Ambulatory Visit: Payer: Self-pay

## 2023-08-02 DIAGNOSIS — E269 Hyperaldosteronism, unspecified: Secondary | ICD-10-CM

## 2023-08-02 DIAGNOSIS — I10 Essential (primary) hypertension: Secondary | ICD-10-CM

## 2023-08-02 MED ORDER — SPIRONOLACTONE 25 MG PO TABS: 25.0000 mg | ORAL_TABLET | Freq: Every day | ORAL | 0 refills | Status: DC

## 2023-08-17 ENCOUNTER — Other Ambulatory Visit: Payer: Self-pay | Admitting: Internal Medicine

## 2023-08-23 ENCOUNTER — Other Ambulatory Visit: Payer: Self-pay | Admitting: Internal Medicine

## 2023-08-23 DIAGNOSIS — I10 Essential (primary) hypertension: Secondary | ICD-10-CM

## 2023-08-25 ENCOUNTER — Other Ambulatory Visit: Payer: Self-pay | Admitting: Obstetrics and Gynecology

## 2023-08-25 DIAGNOSIS — Z1231 Encounter for screening mammogram for malignant neoplasm of breast: Secondary | ICD-10-CM

## 2023-09-02 ENCOUNTER — Other Ambulatory Visit: Payer: Self-pay | Admitting: Internal Medicine

## 2023-09-02 DIAGNOSIS — E269 Hyperaldosteronism, unspecified: Secondary | ICD-10-CM

## 2023-09-02 DIAGNOSIS — I10 Essential (primary) hypertension: Secondary | ICD-10-CM

## 2023-09-13 ENCOUNTER — Other Ambulatory Visit: Payer: Self-pay | Admitting: Internal Medicine

## 2023-09-13 ENCOUNTER — Ambulatory Visit
Admission: RE | Admit: 2023-09-13 | Discharge: 2023-09-13 | Disposition: A | Discharge status: Home / Self Care | Payer: BC Managed Care – PPO | Source: Ambulatory Visit | Attending: Obstetrics and Gynecology | Admitting: Obstetrics and Gynecology

## 2023-09-13 DIAGNOSIS — I10 Essential (primary) hypertension: Secondary | ICD-10-CM

## 2023-09-13 DIAGNOSIS — Z1231 Encounter for screening mammogram for malignant neoplasm of breast: Secondary | ICD-10-CM | POA: Diagnosis not present

## 2023-09-14 ENCOUNTER — Encounter: Payer: Self-pay | Admitting: Internal Medicine

## 2023-09-14 ENCOUNTER — Ambulatory Visit (INDEPENDENT_AMBULATORY_CARE_PROVIDER_SITE_OTHER)

## 2023-09-14 ENCOUNTER — Ambulatory Visit: Payer: BC Managed Care – PPO | Admitting: Internal Medicine

## 2023-09-14 VITALS — BP 144/88 | HR 88 | Temp 98.0°F | Resp 16 | Ht 63.0 in | Wt 183.3 lb

## 2023-09-14 DIAGNOSIS — I10 Essential (primary) hypertension: Secondary | ICD-10-CM

## 2023-09-14 DIAGNOSIS — D538 Other specified nutritional anemias: Secondary | ICD-10-CM | POA: Diagnosis not present

## 2023-09-14 DIAGNOSIS — R059 Cough, unspecified: Secondary | ICD-10-CM | POA: Diagnosis not present

## 2023-09-14 DIAGNOSIS — R052 Subacute cough: Secondary | ICD-10-CM | POA: Diagnosis not present

## 2023-09-14 DIAGNOSIS — E269 Hyperaldosteronism, unspecified: Secondary | ICD-10-CM | POA: Diagnosis not present

## 2023-09-14 DIAGNOSIS — D75839 Thrombocytosis, unspecified: Secondary | ICD-10-CM | POA: Diagnosis not present

## 2023-09-14 DIAGNOSIS — R7303 Prediabetes: Secondary | ICD-10-CM

## 2023-09-14 DIAGNOSIS — D518 Other vitamin B12 deficiency anemias: Secondary | ICD-10-CM | POA: Diagnosis not present

## 2023-09-14 LAB — BASIC METABOLIC PANEL
BUN: 10 mg/dL (ref 6–23)
CO2: 27 meq/L (ref 19–32)
Calcium: 9.8 mg/dL (ref 8.4–10.5)
Chloride: 103 meq/L (ref 96–112)
Creatinine, Ser: 0.81 mg/dL (ref 0.40–1.20)
GFR: 80.31 mL/min (ref >60.00)
Glucose, Bld: 94 mg/dL (ref 70–99)
Potassium: 4 meq/L (ref 3.5–5.1)
Sodium: 138 meq/L (ref 135–145)

## 2023-09-14 LAB — IBC + FERRITIN
Ferritin: 58.6 ng/mL (ref 10.0–291.0)
Iron: 50 ug/dL (ref 42–145)
Saturation Ratios: 11.5 % — ABNORMAL LOW (ref 20.0–50.0)
TIBC: 434 ug/dL (ref 250.0–450.0)
Transferrin: 310 mg/dL (ref 212.0–360.0)

## 2023-09-14 LAB — VITAMIN B12: Vitamin B-12: 453 pg/mL (ref 211–911)

## 2023-09-14 LAB — CBC WITH DIFFERENTIAL/PLATELET
Basophils Absolute: 0 10*3/uL (ref 0.0–0.1)
Basophils Relative: 0.8 % (ref 0.0–3.0)
Eosinophils Absolute: 0.1 10*3/uL (ref 0.0–0.7)
Eosinophils Relative: 2 % (ref 0.0–5.0)
HCT: 38.4 % (ref 36.0–46.0)
Hemoglobin: 12.8 g/dL (ref 12.0–15.0)
Lymphocytes Relative: 24.1 % (ref 12.0–46.0)
Lymphs Abs: 1.3 10*3/uL (ref 0.7–4.0)
MCHC: 33.2 g/dL (ref 30.0–36.0)
MCV: 89.1 fl (ref 78.0–100.0)
Monocytes Absolute: 0.6 10*3/uL (ref 0.1–1.0)
Monocytes Relative: 11.9 % (ref 3.0–12.0)
Neutro Abs: 3.2 10*3/uL (ref 1.4–7.7)
Neutrophils Relative %: 61.2 % (ref 43.0–77.0)
Platelets: 379 10*3/uL (ref 150.0–400.0)
RBC: 4.32 Mil/uL (ref 3.87–5.11)
RDW: 13.4 % (ref 11.5–15.5)
WBC: 5.3 10*3/uL (ref 4.0–10.5)

## 2023-09-14 LAB — HEPATIC FUNCTION PANEL
ALT: 20 U/L (ref 0–35)
AST: 17 U/L (ref 0–37)
Albumin: 4.2 g/dL (ref 3.5–5.2)
Alkaline Phosphatase: 49 U/L (ref 39–117)
Bilirubin, Direct: 0.1 mg/dL (ref 0.0–0.3)
Total Bilirubin: 0.4 mg/dL (ref 0.2–1.2)
Total Protein: 7.5 g/dL (ref 6.0–8.3)

## 2023-09-14 LAB — FOLATE: Folate: 17.6 ng/mL (ref >5.9)

## 2023-09-14 LAB — HEMOGLOBIN A1C: Hgb A1c MFr Bld: 6 % (ref 4.6–6.5)

## 2023-09-14 MED ORDER — OLMESARTAN MEDOXOMIL 40 MG PO TABS
40.0000 mg | ORAL_TABLET | Freq: Every day | ORAL | 1 refills | Status: DC
Start: 2023-09-14 — End: 2024-03-10

## 2023-09-14 MED ORDER — SPIRONOLACTONE 25 MG PO TABS
25.0000 mg | ORAL_TABLET | Freq: Every day | ORAL | 1 refills | Status: DC
Start: 2023-09-14 — End: 2024-03-10

## 2023-09-14 NOTE — Progress Notes (Unsigned)
 Subjective:  Patient ID: Casey Serrano, female    DOB: [birth year]  Age: 58 y.o. MRN: [id number]  CC: Hypertension, Anemia, and Cough   HPI Casey Serrano presents for f/up ----  Discussed the use of AI scribe software for clinical note transcription with the patient, who gave verbal consent to proceed.  History of Present Illness   Casey Serrano is a 58 year old female who presents with a cough and cold symptoms.  She has been experiencing a cough with yellow phlegm for the past five days, which began on Thursday. No fever, chills, night sweats, or shortness of breath. She suspects the cough may be related to allergies and has been wearing a mask as a precaution. No sore throat or gastrointestinal symptoms are present.  She has a history of pneumonia as a small child but has not had any recurrence since then. She tested negative for COVID-19 and has not been tested for the flu. She received a flu shot last year at her workplace.  She maintains an active lifestyle and recently returned from a cruise to [place] and [place], where she was active and walked at least three days a week. She feels well during physical activity without any exertional symptoms such as chest pain or shortness of breath.  Her current medications include a multivitamin, calcium with vitamin D, zinc, and B12 injections. She does not take an iron supplement despite having a tendency for low hemoglobin, though she is not aware of any blood loss.  She experiences restless leg syndrome occasionally but is not on any specific treatment for it.  Her last colonoscopy was eight years ago, and she is overdue for another. Her last Pap smear was three years ago, with the next one scheduled for April. She had a mammogram yesterday.       Outpatient Medications Prior to Visit  Medication Sig Dispense Refill   ALPRAZolam (XANAX) 0.5 MG tablet Take 0.5 mg by mouth 3 (three) times daily as needed.     Calcium Carbonate+Vitamin  D 600-200 MG-UNIT TABS Take 1 tablet by mouth daily.     cyanocobalamin (VITAMIN B12) 1000 MCG/ML injection Inject 1 mL (1,000 mcg total) into the muscle every 30 (thirty) days. 10 mL 0   escitalopram (LEXAPRO) 20 MG tablet Take 20 mg by mouth daily. Takes 1/2 daily     estradiol (ESTRACE) 1 MG tablet Take 1 mg by mouth daily.     Eszopiclone 3 MG TABS Take 3 mg by mouth daily.     ibuprofen (ADVIL) 200 MG tablet Take 200 mg by mouth every 6 (six) hours as needed.     Multiple Vitamins-Minerals (HAIR SKIN AND NAILS FORMULA) TABS Take 1 tablet by mouth daily.     pantoprazole (PROTONIX) 40 MG tablet TAKE 1 TABLET BY MOUTH EVERY DAY 90 tablet 1   progesterone (PROMETRIUM) 100 MG capsule progesterone micronized 100 mg capsule  TAKE 1 CAPSULE BY MOUTH EVERY DAY     valACYclovir (VALTREX) 1000 MG tablet Take 1,000 mg by mouth daily as needed.     Vitamin D, Ergocalciferol, (DRISDOL) 1.25 MG (50000 UNIT) CAPS capsule Take 1 capsule (50,000 Units total) by mouth every 7 (seven) days. 4 capsule 0   olmesartan (BENICAR) 40 MG tablet TAKE 1 TABLET BY MOUTH EVERY DAY 30 tablet 0   spironolactone (ALDACTONE) 25 MG tablet Take 1 tablet (25 mg total) by mouth daily. 60 tablet 0   Zinc 50 MG TABS TAKE 1  TABLET BY MOUTH EVERY DAY 90 tablet 1   famotidine (PEPCID) 20 MG tablet TAKE 1 TABLET BY MOUTH EVERYDAY AT BEDTIME PRN     No facility-administered medications prior to visit.    ROS Review of Systems  Constitutional: Negative.  Negative for chills, fatigue and fever.  HENT: Negative.    Respiratory:  Positive for cough. Negative for chest tightness, shortness of breath and wheezing.   Cardiovascular:  Negative for chest pain, palpitations and leg swelling.  Gastrointestinal:  Negative for abdominal pain, constipation and nausea.  Endocrine: Negative.   Genitourinary: Negative.  Negative for difficulty urinating.  Musculoskeletal: Negative.   Skin: Negative.   Neurological:  Negative for dizziness  and weakness.  Hematological:  Negative for adenopathy. Does not bruise/bleed easily.  Psychiatric/Behavioral: Negative.      Objective:  BP (!) 144/88 (BP Location: Left Arm, Patient Position: Sitting, Cuff Size: Large)   Pulse 88   Temp 98 F (36.7 C) (Oral)   Resp 16   Ht 5\' 3"  (1.6 m)   Wt 183 lb 4.8 oz (83.1 kg)   SpO2 97%   BMI 32.47 kg/m   BP Readings from Last 3 Encounters:  09/14/23 (!) 144/88  06/01/23 110/74  01/05/23 132/76    Wt Readings from Last 3 Encounters:  09/14/23 183 lb 4.8 oz (83.1 kg)  06/01/23 186 lb 6.4 oz (84.6 kg)  01/05/23 187 lb (84.8 kg)    Physical Exam Vitals reviewed.  Constitutional:      Appearance: Normal appearance.  HENT:     Nose: Nose normal.     Mouth/Throat:     Mouth: Mucous membranes are moist.  Eyes:     General: No scleral icterus.    Conjunctiva/sclera: Conjunctivae normal.  Cardiovascular:     Rate and Rhythm: Normal rate and regular rhythm.     Heart sounds: No murmur heard.    No friction rub. No gallop.  Pulmonary:     Effort: Pulmonary effort is normal.     Breath sounds: No stridor. No wheezing, rhonchi or rales.  Abdominal:     General: Abdomen is flat.     Palpations: There is no mass.     Tenderness: There is no abdominal tenderness. There is no guarding.     Hernia: No hernia is present.  Musculoskeletal:        General: Normal range of motion.     Cervical back: Neck supple.     Right lower leg: No edema.     Left lower leg: No edema.  Lymphadenopathy:     Cervical: No cervical adenopathy.  Skin:    General: Skin is warm and dry.  Neurological:     General: No focal deficit present.     Mental Status: She is alert. Mental status is at baseline.  Psychiatric:        Mood and Affect: Mood normal.     Lab Results  Component Value Date   WBC 5.3 09/14/2023   HGB 12.8 09/14/2023   HCT 38.4 09/14/2023   PLT 379.0 09/14/2023   GLUCOSE 94 09/14/2023   CHOL 207 (H) 01/05/2023   TRIG 235.0 (H)  01/05/2023   HDL 54.50 01/05/2023   LDLDIRECT 120.0 01/05/2023   LDLCALC 144 11/19/2021   ALT 20 09/14/2023   AST 17 09/14/2023   NA 138 09/14/2023   K 4.0 09/14/2023   CL 103 09/14/2023   CREATININE 0.81 09/14/2023   BUN 10 09/14/2023   CO2 27  09/14/2023   TSH 4.06 01/05/2023   HGBA1C 6.0 09/14/2023   No results found.    Assessment & Plan:   Primary hypertension- She has not achieved her BP goal of 130/80. Will restart the antihypertensives. -     Hepatic function panel; Future -     Basic metabolic panel; Future -     Olmesartan Medoxomil; Take 1 tablet (40 mg total) by mouth daily.  Dispense: 90 tablet; Refill: 1 -     Spironolactone; Take 1 tablet (25 mg total) by mouth daily.  Dispense: 90 tablet; Refill: 1  Hyperaldosteronism (HCC) -     Basic metabolic panel; Future -     Spironolactone; Take 1 tablet (25 mg total) by mouth daily.  Dispense: 90 tablet; Refill: 1  Prediabetes -     Hemoglobin A1c; Future -     Basic metabolic panel; Future  Vitamin B12 deficiency (dietary) anemia -     CBC with Differential/Platelet; Future -     Vitamin B12; Future -     Folate; Future  Anemia due to zinc deficiency- H/H are normal now. -     CBC with Differential/Platelet; Future  Subacute cough- CXR is normal. -     DG Chest 2 View; Future  Thrombocytosis- PLTs are normal now. -     IBC + Ferritin; Future     Follow-up: Return in about 4 months (around 01/14/2024).  Sanda Linger, MD

## 2023-09-15 ENCOUNTER — Encounter: Payer: Self-pay | Admitting: Internal Medicine

## 2023-09-15 MED ORDER — ZINC 50 MG PO TABS: 1.0000 | ORAL_TABLET | Freq: Every day | ORAL | 1 refills | Status: AC

## 2023-11-19 ENCOUNTER — Other Ambulatory Visit: Payer: Self-pay | Admitting: Internal Medicine

## 2023-11-19 DIAGNOSIS — D518 Other vitamin B12 deficiency anemias: Secondary | ICD-10-CM

## 2023-11-24 DIAGNOSIS — I10 Essential (primary) hypertension: Secondary | ICD-10-CM | POA: Diagnosis not present

## 2023-11-24 DIAGNOSIS — Z13820 Encounter for screening for osteoporosis: Secondary | ICD-10-CM | POA: Diagnosis not present

## 2023-11-24 DIAGNOSIS — Z01419 Encounter for gynecological examination (general) (routine) without abnormal findings: Secondary | ICD-10-CM | POA: Diagnosis not present

## 2023-11-24 DIAGNOSIS — Z6832 Body mass index (BMI) 32.0-32.9, adult: Secondary | ICD-10-CM | POA: Diagnosis not present

## 2023-11-24 DIAGNOSIS — Z124 Encounter for screening for malignant neoplasm of cervix: Secondary | ICD-10-CM | POA: Diagnosis not present

## 2024-02-02 LAB — HM PAP SMEAR

## 2024-02-21 ENCOUNTER — Encounter: Payer: Self-pay | Admitting: Internal Medicine

## 2024-03-10 ENCOUNTER — Other Ambulatory Visit: Payer: Self-pay | Admitting: Internal Medicine

## 2024-03-10 DIAGNOSIS — I10 Essential (primary) hypertension: Secondary | ICD-10-CM

## 2024-03-10 DIAGNOSIS — E269 Hyperaldosteronism, unspecified: Secondary | ICD-10-CM

## 2024-03-23 ENCOUNTER — Ambulatory Visit (INDEPENDENT_AMBULATORY_CARE_PROVIDER_SITE_OTHER)

## 2024-03-23 ENCOUNTER — Ambulatory Visit: Payer: Self-pay | Admitting: Internal Medicine

## 2024-03-23 ENCOUNTER — Ambulatory Visit: Admitting: Internal Medicine

## 2024-03-23 ENCOUNTER — Encounter: Payer: Self-pay | Admitting: Internal Medicine

## 2024-03-23 VITALS — BP 124/78 | HR 78 | Temp 98.7°F | Resp 16 | Ht 63.0 in | Wt 175.0 lb

## 2024-03-23 DIAGNOSIS — Z23 Encounter for immunization: Secondary | ICD-10-CM | POA: Diagnosis not present

## 2024-03-23 DIAGNOSIS — R7303 Prediabetes: Secondary | ICD-10-CM

## 2024-03-23 DIAGNOSIS — M542 Cervicalgia: Secondary | ICD-10-CM | POA: Diagnosis not present

## 2024-03-23 DIAGNOSIS — I10 Essential (primary) hypertension: Secondary | ICD-10-CM | POA: Diagnosis not present

## 2024-03-23 DIAGNOSIS — K219 Gastro-esophageal reflux disease without esophagitis: Secondary | ICD-10-CM

## 2024-03-23 DIAGNOSIS — Z0001 Encounter for general adult medical examination with abnormal findings: Secondary | ICD-10-CM

## 2024-03-23 DIAGNOSIS — E785 Hyperlipidemia, unspecified: Secondary | ICD-10-CM | POA: Diagnosis not present

## 2024-03-23 DIAGNOSIS — D510 Vitamin B12 deficiency anemia due to intrinsic factor deficiency: Secondary | ICD-10-CM

## 2024-03-23 DIAGNOSIS — M47812 Spondylosis without myelopathy or radiculopathy, cervical region: Secondary | ICD-10-CM | POA: Diagnosis not present

## 2024-03-23 DIAGNOSIS — E269 Hyperaldosteronism, unspecified: Secondary | ICD-10-CM

## 2024-03-23 DIAGNOSIS — M4802 Spinal stenosis, cervical region: Secondary | ICD-10-CM | POA: Diagnosis not present

## 2024-03-23 LAB — BASIC METABOLIC PANEL WITH GFR
BUN: 12 mg/dL (ref 6–23)
CO2: 28 meq/L (ref 19–32)
Calcium: 9.1 mg/dL (ref 8.4–10.5)
Chloride: 103 meq/L (ref 96–112)
Creatinine, Ser: 1.01 mg/dL (ref 0.40–1.20)
GFR: 61.4 mL/min (ref >60.00)
Glucose, Bld: 92 mg/dL (ref 70–99)
Potassium: 3.6 meq/L (ref 3.5–5.1)
Sodium: 139 meq/L (ref 135–145)

## 2024-03-23 LAB — CBC WITH DIFFERENTIAL/PLATELET
Basophils Absolute: 0.1 K/uL (ref 0.0–0.1)
Basophils Relative: 0.8 % (ref 0.0–3.0)
Eosinophils Absolute: 0.1 K/uL (ref 0.0–0.7)
Eosinophils Relative: 1.7 % (ref 0.0–5.0)
HCT: 39.5 % (ref 36.0–46.0)
Hemoglobin: 12.8 g/dL (ref 12.0–15.0)
Lymphocytes Relative: 34.7 % (ref 12.0–46.0)
Lymphs Abs: 2.6 K/uL (ref 0.7–4.0)
MCHC: 32.5 g/dL (ref 30.0–36.0)
MCV: 88.3 fl (ref 78.0–100.0)
Monocytes Absolute: 0.5 K/uL (ref 0.1–1.0)
Monocytes Relative: 7.1 % (ref 3.0–12.0)
Neutro Abs: 4.1 K/uL (ref 1.4–7.7)
Neutrophils Relative %: 55.7 % (ref 43.0–77.0)
Platelets: 439 K/uL — ABNORMAL HIGH (ref 150.0–400.0)
RBC: 4.47 Mil/uL (ref 3.87–5.11)
RDW: 13 % (ref 11.5–15.5)
WBC: 7.4 K/uL (ref 4.0–10.5)

## 2024-03-23 LAB — LIPID PANEL
Cholesterol: 208 mg/dL — ABNORMAL HIGH (ref 0–200)
HDL: 56.7 mg/dL (ref >39.00)
LDL Cholesterol: 113 mg/dL — ABNORMAL HIGH (ref 0–99)
NonHDL: 151.25
Total CHOL/HDL Ratio: 4
Triglycerides: 191 mg/dL — ABNORMAL HIGH (ref 0.0–149.0)
VLDL: 38.2 mg/dL (ref 0.0–40.0)

## 2024-03-23 LAB — HEMOGLOBIN A1C: Hgb A1c MFr Bld: 6.2 % (ref 4.6–6.5)

## 2024-03-23 LAB — TSH: TSH: 1.67 u[IU]/mL (ref 0.35–5.50)

## 2024-03-23 MED ORDER — OLMESARTAN MEDOXOMIL 20 MG PO TABS: 20.0000 mg | ORAL_TABLET | Freq: Every day | ORAL | 1 refills | Status: DC

## 2024-03-23 MED ORDER — SPIRONOLACTONE 25 MG PO TABS: 25.0000 mg | ORAL_TABLET | Freq: Every day | ORAL | 1 refills | Status: DC

## 2024-03-23 NOTE — Progress Notes (Unsigned)
 Subjective:  Patient ID: Casey Serrano, female    DOB: 03/14/1966  Age: 58 y.o. MRN: [id number]  CC: Annual Exam and Hypertension   HPI Casey Serrano presents for a CPX and f/up ---  Discussed the use of AI scribe software for clinical note transcription with the patient, who gave verbal consent to proceed.  History of Present Illness Casey Serrano is a 58 year old female with hypertension and GERD who presents for a follow-up visit.  She feels well overall and tries to stay active, noting improvement in her well-being with activity. She experiences no chest pain, shortness of breath, dizziness, or lightheadedness during activity. Occasional dizziness occurs when she is not adequately hydrated, which she attributes to spironolactone  use. Her blood pressure remains stable.  She experiences minimal swelling in her legs and feet, which she attributes to prolonged sitting at work and wearing socks. She uses compression socks to manage this swelling.  She has neck pain localized to the base of her skull without radiation to her arms or legs. She has a history of a fall while skiing a few years ago, resulting in a neck injury, and she wonders if this could be related to her current symptoms. She occasionally takes ibuprofen for pain relief.  Her GERD symptoms have improved compared to the past. She takes pantoprazole  daily and famotidine as needed. She has a history of painful swallowing and has undergone an upper GI endoscopy with esophageal dilation. She occasionally has difficulty swallowing pills but does not feel the need for another dilation currently.     Outpatient Medications Prior to Visit  Medication Sig Dispense Refill   ALPRAZolam (XANAX) 0.5 MG tablet Take 0.5 mg by mouth 3 (three) times daily as needed.     Calcium Carbonate+Vitamin D  600-200 MG-UNIT TABS Take 1 tablet by mouth daily.     cyanocobalamin  (VITAMIN B12) 1000 MCG/ML injection INJECT 1 ML (1,000 MCG TOTAL) INTO  THE MUSCLE EVERY 30 DAYS. 3 mL 3   escitalopram (LEXAPRO) 20 MG tablet Take 20 mg by mouth daily. Takes 1/2 daily     estradiol  (ESTRACE ) 1 MG tablet Take 1 mg by mouth daily.     ibuprofen (ADVIL) 200 MG tablet Take 200 mg by mouth every 6 (six) hours as needed.     Multiple Vitamins-Minerals (HAIR SKIN AND NAILS FORMULA) TABS Take 1 tablet by mouth daily.     pantoprazole  (PROTONIX ) 40 MG tablet TAKE 1 TABLET BY MOUTH EVERY DAY 90 tablet 1   progesterone  (PROMETRIUM ) 100 MG capsule progesterone  micronized 100 mg capsule  TAKE 1 CAPSULE BY MOUTH EVERY DAY     valACYclovir (VALTREX) 1000 MG tablet Take 1,000 mg by mouth daily as needed.     Vitamin D , Ergocalciferol , (DRISDOL ) 1.25 MG (50000 UNIT) CAPS capsule Take 1 capsule (50,000 Units total) by mouth every 7 (seven) days. 4 capsule 0   Zinc  50 MG TABS Take 1 tablet (50 mg total) by mouth daily. 90 tablet 1   zolpidem (AMBIEN) 10 MG tablet Take 10 mg by mouth daily as needed.     olmesartan  (BENICAR ) 40 MG tablet TAKE 1 TABLET BY MOUTH EVERY DAY 90 tablet 1   spironolactone  (ALDACTONE ) 25 MG tablet TAKE 1 TABLET (25 MG TOTAL) BY MOUTH DAILY. 30 tablet 0   Eszopiclone 3 MG TABS Take 3 mg by mouth daily.     olmesartan  (BENICAR ) 40 MG tablet TAKE 1 TABLET BY MOUTH EVERY DAY 30 tablet 0  No facility-administered medications prior to visit.    ROS Review of Systems  Constitutional:  Negative for chills, diaphoresis, fatigue and fever.  HENT:  Positive for trouble swallowing. Negative for sinus pressure.   Eyes: Negative.   Respiratory: Negative.  Negative for cough, chest tightness, shortness of breath and wheezing.   Cardiovascular:  Negative for chest pain, palpitations and leg swelling.  Gastrointestinal: Negative.  Negative for abdominal pain, blood in stool, constipation, diarrhea, nausea and vomiting.  Endocrine: Negative.  Negative for polyuria.  Genitourinary: Negative.  Negative for difficulty urinating and frequency.   Musculoskeletal:  Positive for neck pain. Negative for back pain, gait problem and myalgias.  Skin: Negative.   Allergic/Immunologic: Negative.   Neurological:  Positive for dizziness and light-headedness. Negative for tremors, syncope, numbness and headaches.  Hematological:  Negative for adenopathy. Does not bruise/bleed easily.  Psychiatric/Behavioral: Negative.      Objective:  BP 124/78 (BP Location: Left Arm, Patient Position: Sitting, Cuff Size: Normal)   Pulse 78   Temp 98.7 F (37.1 C) (Temporal)   Resp 16   Ht 5' 3 (1.6 m)   Wt 175 lb (79.4 kg)   SpO2 97%   BMI 31.00 kg/m   BP Readings from Last 3 Encounters:  03/23/24 124/78  09/14/23 (!) 144/88  06/01/23 110/74    Wt Readings from Last 3 Encounters:  03/23/24 175 lb (79.4 kg)  09/14/23 183 lb 4.8 oz (83.1 kg)  06/01/23 186 lb 6.4 oz (84.6 kg)    Physical Exam Vitals reviewed.  Constitutional:      Appearance: Normal appearance.  HENT:     Nose: Nose normal.     Mouth/Throat:     Mouth: Mucous membranes are moist.  Eyes:     General: No scleral icterus.    Conjunctiva/sclera: Conjunctivae normal.  Cardiovascular:     Rate and Rhythm: Normal rate and regular rhythm.     Heart sounds: No murmur heard.    No friction rub. No gallop.     Comments: EKG--- NSR, 81 bpm Anteroseptal infarct pattern is not new No LVH Unchanged Pulmonary:     Effort: Pulmonary effort is normal.     Breath sounds: No stridor. No wheezing, rhonchi or rales.  Abdominal:     General: Abdomen is flat.     Palpations: There is no mass.     Tenderness: There is no abdominal tenderness. There is no guarding.     Hernia: No hernia is present.  Musculoskeletal:        General: Normal range of motion.     Cervical back: Full passive range of motion without pain and neck supple. No deformity, signs of trauma or bony tenderness. No pain with movement. Normal range of motion.     Right lower leg: No edema.     Left lower leg: No  edema.  Lymphadenopathy:     Cervical: No cervical adenopathy.  Neurological:     General: No focal deficit present.     Mental Status: She is alert. Mental status is at baseline.  Psychiatric:        Mood and Affect: Mood normal.        Behavior: Behavior normal.     Lab Results  Component Value Date   WBC 7.4 03/23/2024   HGB 12.8 03/23/2024   HCT 39.5 03/23/2024   PLT 439.0 (H) 03/23/2024   GLUCOSE 92 03/23/2024   CHOL 208 (H) 03/23/2024   TRIG 191.0 (H) 03/23/2024  HDL 56.70 03/23/2024   LDLDIRECT 120.0 01/05/2023   LDLCALC 113 (H) 03/23/2024   ALT 20 09/14/2023   AST 17 09/14/2023   NA 139 03/23/2024   K 3.6 03/23/2024   CL 103 03/23/2024   CREATININE 1.01 03/23/2024   BUN 12 03/23/2024   CO2 28 03/23/2024   TSH 1.67 03/23/2024   HGBA1C 6.2 03/23/2024    MM 3D SCREENING MAMMOGRAM BILATERAL BREAST Result Date: 09/15/2023 CLINICAL DATA:  Screening. EXAM: DIGITAL SCREENING BILATERAL MAMMOGRAM WITH TOMOSYNTHESIS AND CAD TECHNIQUE: Bilateral screening digital craniocaudal and mediolateral oblique mammograms were obtained. Bilateral screening digital breast tomosynthesis was performed. The images were evaluated with computer-aided detection. COMPARISON:  Previous exam(s). ACR Breast Density Category b: There are scattered areas of fibroglandular density. FINDINGS: There are no findings suspicious for malignancy. IMPRESSION: No mammographic evidence of malignancy. A result letter of this screening mammogram will be mailed directly to the patient. RECOMMENDATION: Screening mammogram in one year. (Code:SM-B-01Y) BI-RADS CATEGORY  1: Negative. Electronically Signed   By: Dina  Arceo M.D.   On: 09/15/2023 13:57   DG Chest 2 View Result Date: 09/14/2023 CLINICAL DATA:  Cough. EXAM: CHEST - 2 VIEW COMPARISON:  Chest radiograph dated 06/19/2022. FINDINGS: The heart size and mediastinal contours are within normal limits. Both lungs are clear. The visualized skeletal structures are  unremarkable. IMPRESSION: No active cardiopulmonary disease. Electronically Signed   By: Vanetta Chou M.D.   On: 09/14/2023 10:16    DG Cervical Spine Complete Result Date: 03/23/2024 CLINICAL DATA:  pain L>R Neck pain x 6 months, after a skiing accident. Unable to remove earrings. EXAM: CERVICAL SPINE - COMPLETE 4+ VIEW COMPARISON:  Chest XR, 09/14/2023 FINDINGS: There is no evidence of cervical spine fracture or prevertebral soft tissue swelling. No traumatic malalignment. Mild reversal of the normal cervical lordosis. Scattered cervical spine degenerative change, with mild widening of the intervertebral disc space at C4-5 and narrowing at C5-6, anterior osteophytes at C5-6 and facet arthropathy. No acute significant bone abnormalities are identified. IMPRESSION: 1. No acute displaced cervical spine fracture or traumatic malalignment. 2. Scattered cervical spine degenerative change, as above. Electronically Signed   By: Thom Hall M.D.   On: 03/23/2024 14:57     Assessment & Plan:  Need for immunization against influenza -     Flu vaccine trivalent PF, 6mos and older(Flulaval,Afluria,Fluarix,Fluzone)  Primary hypertension- Her BP is over-controlled. Will lower the ARB dose. EKG is negative for LVH. -     EKG 12-Lead -     Basic metabolic panel with GFR; Future -     CBC with Differential/Platelet; Future -     TSH; Future -     Olmesartan  Medoxomil; Take 1 tablet (20 mg total) by mouth daily.  Dispense: 90 tablet; Refill: 1 -     Spironolactone ; Take 1 tablet (25 mg total) by mouth daily.  Dispense: 90 tablet; Refill: 1  Gastroesophageal reflux disease without esophagitis- Will continue the PPI.  Hyperaldosteronism -     Basic metabolic panel with GFR; Future -     Spironolactone ; Take 1 tablet (25 mg total) by mouth daily.  Dispense: 90 tablet; Refill: 1  Dyslipidemia, goal LDL below 70- Statin is not indicated. -     Lipid panel; Future -     TSH; Future  Encounter for general  adult medical examination with abnormal findings  Prediabetes -     Basic metabolic panel with GFR; Future -     Hemoglobin A1c; Future  Neck pain, bilateral posterior -     DG Cervical Spine Complete; Future  Vitamin B12 deficiency anemia due to intrinsic factor deficiency     Follow-up: Return in about 6 months (around 09/20/2024).  Debby Molt, MD

## 2024-03-23 NOTE — Patient Instructions (Signed)

## 2024-03-24 ENCOUNTER — Other Ambulatory Visit (HOSPITAL_COMMUNITY): Payer: Self-pay

## 2024-03-24 ENCOUNTER — Other Ambulatory Visit: Payer: Self-pay | Admitting: Internal Medicine

## 2024-03-24 ENCOUNTER — Telehealth: Payer: Self-pay

## 2024-03-24 DIAGNOSIS — R7303 Prediabetes: Secondary | ICD-10-CM

## 2024-03-24 MED ORDER — RYBELSUS 3 MG PO TABS: 3.0000 mg | ORAL_TABLET | Freq: Every day | ORAL | 0 refills | Status: DC

## 2024-03-24 MED ORDER — PANTOPRAZOLE SODIUM 40 MG PO TBEC: 40.0000 mg | DELAYED_RELEASE_TABLET | Freq: Every day | ORAL | 1 refills | Status: AC

## 2024-03-24 NOTE — Telephone Encounter (Signed)
 Pharmacy Patient Advocate Encounter   Received notification from CoverMyMeds that prior authorization for Rybelsus  3MG  tablets  is required/requested.   Insurance verification completed.   The patient is insured through [facility] .   Per test claim: PA required; PA submitted to above mentioned insurance via Latent Key/confirmation #/EOC [id number] Status is pending

## 2024-03-27 ENCOUNTER — Other Ambulatory Visit (HOSPITAL_COMMUNITY): Payer: Self-pay

## 2024-03-27 NOTE — Telephone Encounter (Signed)
 Pharmacy Patient Advocate Encounter  Received notification from [employer] that Prior Authorization for Rybelsus  3MG  tablets  has been DENIED.  See denial reason below. No denial letter attached in CMM. Will attach denial letter to Media tab once received.   PA #/Case ID/Reference #: [id number]

## 2024-03-29 ENCOUNTER — Telehealth: Payer: Self-pay | Admitting: Cardiovascular Disease

## 2024-03-29 DIAGNOSIS — R002 Palpitations: Secondary | ICD-10-CM

## 2024-03-29 NOTE — Telephone Encounter (Signed)
 Patient wants to get orders to have a heart monitor test.

## 2024-03-29 NOTE — Telephone Encounter (Signed)
 Spoke with patient. She wore a monitor in 2023, ordered by PCP. She was unable to wear monitor for a longer period of time due to adhesive sensitivity. This prompted a cardiology referral - aflutter. She would like to wear a heart monitor again. She notes heart palpitations, flutters. Is currently going thru menopause. Recent labs with TSH, BMET WNL.   Advised will route to primary cardiologist (new - Dr. Alveta retired) and PA with whom she is scheduled with in Dec - sooner appt for eval or monitor order appropriate based on reports of palps?

## 2024-03-29 NOTE — Telephone Encounter (Signed)
 Patient is returning call. Please advise?

## 2024-03-29 NOTE — Telephone Encounter (Signed)
 Left message to call back. Unclear why a heart monitor test is requested. Last visit 05/2023. Upcoming visit 05/2024.

## 2024-03-30 NOTE — Telephone Encounter (Signed)
 Patient has been made aware and gave a verbal understanding.

## 2024-03-30 NOTE — Telephone Encounter (Signed)
 Attempted to call pt, unable to reach. LMTCB. 7 day Zio order has been placed.

## 2024-03-31 ENCOUNTER — Ambulatory Visit: Attending: Cardiovascular Disease

## 2024-03-31 DIAGNOSIS — R002 Palpitations: Secondary | ICD-10-CM

## 2024-03-31 NOTE — Progress Notes (Unsigned)
 Enrolled for [employer] to mail a ZIO XT long term holter monitor to the patients address on file.

## 2024-04-17 ENCOUNTER — Ambulatory Visit: Payer: Self-pay | Admitting: Cardiovascular Disease

## 2024-04-17 DIAGNOSIS — R002 Palpitations: Secondary | ICD-10-CM

## 2024-04-25 ENCOUNTER — Telehealth: Admitting: Family Medicine

## 2024-04-25 DIAGNOSIS — J069 Acute upper respiratory infection, unspecified: Secondary | ICD-10-CM | POA: Diagnosis not present

## 2024-04-25 MED ORDER — IPRATROPIUM BROMIDE 0.03 % NA SOLN: 2.0000 | Freq: Two times a day (BID) | NASAL | 0 refills | Status: AC

## 2024-04-25 MED ORDER — BENZONATATE 100 MG PO CAPS: 100.0000 mg | ORAL_CAPSULE | Freq: Three times a day (TID) | ORAL | 0 refills | Status: AC | PRN

## 2024-04-25 NOTE — Progress Notes (Signed)

## 2024-05-30 NOTE — Progress Notes (Unsigned)
 Cardiology Office Note   Date:  06/01/2024  ID:  Casey Serrano, DOB 08-01-1965, MRN [id number] PCP: Joshua Debby LITTIE, MD  [facility] Providers Cardiologist:  None   History of Present Illness Casey Serrano is a 58 y.o. female with a past medical history of atrial flutter, HTN, palpitations, and atrial fibrillation here for follow-up appointment.  She was seen a year ago and at that time her event monitor showed atrial fibrillation.  Had allergic reaction to the adhesive on the monitor so she only were able to wear it for 1 day.  Fortunately, it did show atrial fibrillation early that morning.  Does have a history of palpitations and thought this was due to the onset of menopause.  Does not have OSA for which she can tell.  She knows that she does not sleep well.  Her father had passed away a few weeks prior to her appointment.  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  She used to exercise but unfortunately had not done so over the past year.  Her chads Vascor is 2 (female, HTN)  she is also prediabetic.  Lipids are minimal.  Calcium score 0 back in March 2022.  Today, she presents with a hx of atrial fibrillation and hyperlipidemia who presents for cardiovascular follow-up.  She describes intermittent, brief "instantaneous" palpitations. She wonders if these relate to perimenopause and current estradiol  1 mg daily.  She takes spironolactone  25 mg daily and Benicar  20 mg daily. A prior CT showed a calcium score of zero. Recent Zio monitor showed no atrial fibrillation or significant arrhythmias and an average heart rate of 83 bpm.  Her LDL is 113 mg/dL. Both parents had high cholesterol, and her father recently died from heart failure.  Recent labs showed A1c 6.2%. Creatinine, kidney function, electrolytes, and TSH were normal.  Reports no shortness of breath nor dyspnea on exertion. Reports no chest pain, pressure, or  tightness. No edema, orthopnea, PND.    Discussed the use of AI scribe software for clinical note transcription with the patient, who gave verbal consent to proceed.  ROS: Pertinent ROS in HPI  Studies Reviewed     Zio monitor April 07, 2024   Patch Wear Time:  9 days and 20 hours (2025-10-06T19:47:16-0400 to 2025-10-16T16:35:01-399)   Patient had a min HR of 61 bpm, max HR of 160 bpm, and avg HR of 83 bpm. Predominant underlying rhythm was Sinus Rhythm. 1 run of Supraventricular Tachycardia occurred lasting 4 beats with a max rate of 160 bpm (avg 148 bpm). Isolated SVEs were rare  (<1.0%), SVE Couplets were rare (<1.0%), and SVE Triplets were rare (<1.0%). Isolated VEs were rare (<1.0%), and no VE Couplets or VE Triplets were present.    SUMMARY: The basic rhythm is normal sinus with an average HR of 83 bpm. There are rare ventricular and supraventricular ectopics, both occurring at low frequency of <1%. No atrial fibrillation or flutter. No significant arrhythmia.   Risk Assessment/Calculations  CHA2DS2-VASc Score = 2  This indicates a 2.2% annual risk of stroke. The patient's score is based upon: CHF History: 0 HTN History: 1 Diabetes History: 0 Stroke History: 0 Vascular Disease History: 0 Age Score: 0 Gender Score: 1             Physical Exam VS:  BP 106/68   Pulse 78   Ht 5' 2 (1.575 m)   Wt 173 lb (78.5 kg)  SpO2 99%   BMI 31.64 kg/m        Wt Readings from Last 3 Encounters:  06/01/24 173 lb (78.5 kg)  03/23/24 175 lb (79.4 kg)  09/14/23 183 lb 4.8 oz (83.1 kg)    GEN: Well nourished, well developed in no acute distress NECK: No JVD; No carotid bruits CARDIAC: RRR, no murmurs, rubs, gallops RESPIRATORY:  Clear to auscultation without rales, wheezing or rhonchi  ABDOMEN: Soft, non-tender, non-distended EXTREMITIES:  No edema; No deformity   ASSESSMENT AND PLAN  Hyperlipidemia LDL cholesterol elevated at 113 mg/dL, above target for zero calcium score.  Family history of hyperlipidemia and heart disease. Prefers lifestyle changes over medication. - Recheck lipid panel in March. - Provided literature on Mediterranean diet. - Encouraged diet and exercise modifications.  Primary hypertension Blood pressure well-controlled on current medications. - Continue spironolactone  25 mg daily. - Continue Benicar  20 mg daily.  Paroxysmal atrial fibrillation No significant arrhythmias on Zio monitor. Heart rate 78 bpm, normal range. - Continue current management.   Dispo: She can follow-up in a year with Dr. Wonda  Signed, Orren LOISE Fabry, PA-C

## 2024-06-01 ENCOUNTER — Encounter: Payer: Self-pay | Admitting: Physician Assistant

## 2024-06-01 ENCOUNTER — Ambulatory Visit: Attending: Physician Assistant | Admitting: Physician Assistant

## 2024-06-01 VITALS — BP 106/68 | HR 78 | Ht 62.0 in | Wt 173.0 lb

## 2024-06-01 DIAGNOSIS — E269 Hyperaldosteronism, unspecified: Secondary | ICD-10-CM

## 2024-06-01 DIAGNOSIS — I480 Paroxysmal atrial fibrillation: Secondary | ICD-10-CM | POA: Diagnosis not present

## 2024-06-01 DIAGNOSIS — R002 Palpitations: Secondary | ICD-10-CM

## 2024-06-01 DIAGNOSIS — I10 Essential (primary) hypertension: Secondary | ICD-10-CM

## 2024-06-01 MED ORDER — OLMESARTAN MEDOXOMIL 20 MG PO TABS: 20.0000 mg | ORAL_TABLET | Freq: Every day | ORAL | 2 refills | Status: AC

## 2024-06-01 MED ORDER — SPIRONOLACTONE 25 MG PO TABS: 25.0000 mg | ORAL_TABLET | Freq: Every day | ORAL | 2 refills | Status: AC

## 2024-06-01 NOTE — Patient Instructions (Signed)
 Medication Instructions:  Your physician recommends that you continue on your current medications as directed. Please refer to the Current Medication list given to you today. *If you need a refill on your cardiac medications before your next appointment, please call your pharmacy*  Lab Work: 3 MONTHS FASTING LIPIDS (08/30/2024) If you have labs (blood work) drawn today and your tests are completely normal, you will receive your results only by: MyChart Message (if you have MyChart) OR A paper copy in the mail If you have any lab test that is abnormal or we need to change your treatment, we will call you to review the results.  Testing/Procedures: NONE ORDERED  Follow-Up: At [facility], you and your health needs are our priority.  As part of our continuing mission to provide you with exceptional heart care, our providers are all part of one team.  This team includes your primary Cardiologist (physician) and Advanced Practice Providers or APPs (Physician Assistants and Nurse Practitioners) who all work together to provide you with the care you need, when you need it.  Your next appointment:   12 month(s)  Provider:   Ozell Fell, MD   We recommend signing up for the patient portal called MyChart.  Sign up information is provided on this After Visit Summary.  MyChart is used to connect with patients for Virtual Visits (Telemedicine).  Patients are able to view lab/test results, encounter notes, upcoming appointments, etc.  Non-urgent messages can be sent to your provider as well.   To learn more about what you can do with MyChart, go to [email or web address].   Other Instructions
